# Patient Record
Sex: Female | Born: 1970
Health system: Southern US, Community
[De-identification: ages and names within clinical notes are randomized; demographics above are authoritative.]

## PROBLEM LIST (undated history)

## (undated) DIAGNOSIS — L309 Dermatitis, unspecified: Secondary | ICD-10-CM

## (undated) DIAGNOSIS — J189 Pneumonia, unspecified organism: Secondary | ICD-10-CM

## (undated) DIAGNOSIS — R112 Nausea with vomiting, unspecified: Secondary | ICD-10-CM

## (undated) DIAGNOSIS — M1990 Unspecified osteoarthritis, unspecified site: Secondary | ICD-10-CM

## (undated) DIAGNOSIS — J45909 Unspecified asthma, uncomplicated: Secondary | ICD-10-CM

## (undated) DIAGNOSIS — Z9889 Other specified postprocedural states: Secondary | ICD-10-CM

## (undated) DIAGNOSIS — T783XXA Angioneurotic edema, initial encounter: Secondary | ICD-10-CM

## (undated) DIAGNOSIS — M1631 Unilateral osteoarthritis resulting from hip dysplasia, right hip: Secondary | ICD-10-CM

## (undated) DIAGNOSIS — K900 Celiac disease: Secondary | ICD-10-CM

## (undated) DIAGNOSIS — L509 Urticaria, unspecified: Secondary | ICD-10-CM

## (undated) HISTORY — PX: BREAST SURGERY: SHX581

## (undated) HISTORY — DX: Angioneurotic edema, initial encounter: T78.3XXA

## (undated) HISTORY — DX: Dermatitis, unspecified: L30.9

## (undated) HISTORY — DX: Urticaria, unspecified: L50.9

## (undated) HISTORY — PX: CARPAL TUNNEL RELEASE: SHX101

## (undated) HISTORY — PX: OTHER SURGICAL HISTORY: SHX169

---

## 2012-05-23 ENCOUNTER — Other Ambulatory Visit: Payer: Self-pay

## 2012-05-23 ENCOUNTER — Other Ambulatory Visit: Payer: Self-pay | Admitting: Legal Medicine

## 2012-05-23 DIAGNOSIS — N644 Mastodynia: Secondary | ICD-10-CM

## 2012-05-23 DIAGNOSIS — N63 Unspecified lump in unspecified breast: Secondary | ICD-10-CM

## 2012-05-25 ENCOUNTER — Other Ambulatory Visit: Payer: Self-pay | Admitting: Legal Medicine

## 2012-05-25 DIAGNOSIS — N644 Mastodynia: Secondary | ICD-10-CM

## 2012-05-25 DIAGNOSIS — N63 Unspecified lump in unspecified breast: Secondary | ICD-10-CM

## 2012-06-01 ENCOUNTER — Other Ambulatory Visit: Payer: Self-pay

## 2012-08-12 ENCOUNTER — Ambulatory Visit
Admission: RE | Admit: 2012-08-12 | Discharge: 2012-08-12 | Disposition: A | Discharge status: Home / Self Care | Payer: PRIVATE HEALTH INSURANCE | Source: Ambulatory Visit | Attending: Legal Medicine | Admitting: Legal Medicine

## 2012-08-12 DIAGNOSIS — N644 Mastodynia: Secondary | ICD-10-CM

## 2012-08-12 DIAGNOSIS — N63 Unspecified lump in unspecified breast: Secondary | ICD-10-CM

## 2013-11-14 ENCOUNTER — Other Ambulatory Visit: Payer: Self-pay

## 2013-11-14 ENCOUNTER — Other Ambulatory Visit: Payer: Self-pay | Admitting: Gynecology

## 2013-11-14 DIAGNOSIS — Z1231 Encounter for screening mammogram for malignant neoplasm of breast: Secondary | ICD-10-CM

## 2013-11-28 ENCOUNTER — Ambulatory Visit
Admission: RE | Admit: 2013-11-28 | Discharge: 2013-11-28 | Disposition: A | Discharge status: Home / Self Care | Payer: PRIVATE HEALTH INSURANCE | Source: Ambulatory Visit

## 2013-11-28 DIAGNOSIS — Z1231 Encounter for screening mammogram for malignant neoplasm of breast: Secondary | ICD-10-CM

## 2013-12-01 ENCOUNTER — Other Ambulatory Visit: Payer: Self-pay | Admitting: Obstetrics and Gynecology

## 2013-12-01 DIAGNOSIS — R928 Other abnormal and inconclusive findings on diagnostic imaging of breast: Secondary | ICD-10-CM

## 2013-12-15 ENCOUNTER — Ambulatory Visit
Admission: RE | Admit: 2013-12-15 | Discharge: 2013-12-15 | Disposition: A | Discharge status: Home / Self Care | Payer: PRIVATE HEALTH INSURANCE | Source: Ambulatory Visit | Attending: Obstetrics and Gynecology | Admitting: Obstetrics and Gynecology

## 2013-12-15 ENCOUNTER — Other Ambulatory Visit: Payer: Self-pay | Admitting: Obstetrics and Gynecology

## 2013-12-15 DIAGNOSIS — R928 Other abnormal and inconclusive findings on diagnostic imaging of breast: Secondary | ICD-10-CM

## 2013-12-20 ENCOUNTER — Other Ambulatory Visit: Payer: Self-pay | Admitting: Obstetrics and Gynecology

## 2013-12-20 ENCOUNTER — Ambulatory Visit
Admission: RE | Admit: 2013-12-20 | Discharge: 2013-12-20 | Disposition: A | Discharge status: Home / Self Care | Payer: PRIVATE HEALTH INSURANCE | Source: Ambulatory Visit | Attending: Obstetrics and Gynecology | Admitting: Obstetrics and Gynecology

## 2013-12-20 ENCOUNTER — Other Ambulatory Visit (HOSPITAL_COMMUNITY): Payer: Self-pay | Admitting: Radiology

## 2013-12-20 DIAGNOSIS — R928 Other abnormal and inconclusive findings on diagnostic imaging of breast: Secondary | ICD-10-CM

## 2013-12-20 DIAGNOSIS — N6489 Other specified disorders of breast: Secondary | ICD-10-CM

## 2014-05-24 ENCOUNTER — Other Ambulatory Visit: Payer: Self-pay | Admitting: Obstetrics and Gynecology

## 2014-05-24 DIAGNOSIS — N6489 Other specified disorders of breast: Secondary | ICD-10-CM

## 2014-06-27 ENCOUNTER — Encounter (INDEPENDENT_AMBULATORY_CARE_PROVIDER_SITE_OTHER): Payer: Self-pay

## 2014-06-27 ENCOUNTER — Ambulatory Visit
Admission: RE | Admit: 2014-06-27 | Discharge: 2014-06-27 | Disposition: A | Discharge status: Home / Self Care | Payer: PRIVATE HEALTH INSURANCE | Source: Ambulatory Visit | Attending: Obstetrics and Gynecology | Admitting: Obstetrics and Gynecology

## 2014-06-27 DIAGNOSIS — N6489 Other specified disorders of breast: Secondary | ICD-10-CM

## 2015-04-25 ENCOUNTER — Encounter (HOSPITAL_COMMUNITY): Payer: Self-pay

## 2015-04-25 ENCOUNTER — Other Ambulatory Visit (HOSPITAL_COMMUNITY): Payer: Self-pay | Admitting: *Deleted

## 2015-04-25 ENCOUNTER — Encounter (HOSPITAL_COMMUNITY)
Admission: RE | Admit: 2015-04-25 | Discharge: 2015-04-25 | Disposition: A | Discharge status: Home / Self Care | Payer: PRIVATE HEALTH INSURANCE | Source: Ambulatory Visit | Attending: Orthopedic Surgery | Admitting: Orthopedic Surgery

## 2015-04-25 DIAGNOSIS — Z01812 Encounter for preprocedural laboratory examination: Secondary | ICD-10-CM | POA: Insufficient documentation

## 2015-04-25 DIAGNOSIS — M1611 Unilateral primary osteoarthritis, right hip: Secondary | ICD-10-CM | POA: Diagnosis not present

## 2015-04-25 HISTORY — DX: Pneumonia, unspecified organism: J18.9

## 2015-04-25 HISTORY — DX: Celiac disease: K90.0

## 2015-04-25 HISTORY — DX: Other specified postprocedural states: Z98.890

## 2015-04-25 HISTORY — DX: Nausea with vomiting, unspecified: R11.2

## 2015-04-25 HISTORY — DX: Unspecified asthma, uncomplicated: J45.909

## 2015-04-25 HISTORY — DX: Unspecified osteoarthritis, unspecified site: M19.90

## 2015-04-25 LAB — CBC
HEMATOCRIT: 39 % (ref 36.0–46.0)
HEMOGLOBIN: 12.9 g/dL (ref 12.0–15.0)
MCH: 30 pg (ref 26.0–34.0)
MCHC: 33.1 g/dL (ref 30.0–36.0)
MCV: 90.7 fL (ref 78.0–100.0)
Platelets: 306 10*3/uL (ref 150–400)
RBC: 4.3 MIL/uL (ref 3.87–5.11)
RDW: 14.4 % (ref 11.5–15.5)
WBC: 6.5 10*3/uL (ref 4.0–10.5)

## 2015-04-25 LAB — SURGICAL PCR SCREEN
MRSA, PCR: NEGATIVE
STAPHYLOCOCCUS AUREUS: POSITIVE — AB

## 2015-04-25 LAB — BASIC METABOLIC PANEL
ANION GAP: 5 (ref 5–15)
BUN: 8 mg/dL (ref 6–20)
CALCIUM: 9.4 mg/dL (ref 8.9–10.3)
CO2: 28 mmol/L (ref 22–32)
CREATININE: 0.56 mg/dL (ref 0.44–1.00)
Chloride: 107 mmol/L (ref 101–111)
Glucose, Bld: 91 mg/dL (ref 65–99)
Potassium: 4.1 mmol/L (ref 3.5–5.1)
Sodium: 140 mmol/L (ref 135–145)

## 2015-04-25 NOTE — Progress Notes (Signed)
Mupirocin Ointment Rx called into [facility] in [place] for positive PCR of staph. Pt notified and voiced understanding.

## 2015-04-25 NOTE — Pre-Procedure Instructions (Signed)
Melinda Guerrero  04/25/2015      Your procedure is scheduled on Tuesday, May 07, 2015 at 10:25 AM.   [facility] Entr[facility] "A" Admitting Office at 8:30 AM.   Call this number if you have problems the morning of surgery: [phone]   Any questions prior to day of surgery, please call [phone] between 8 & 4 PM.    Remember:  Do not eat food or drink liquids after midnight Monday, 05/06/15.  Take these medicines the morning of surgery with A SIP OF WATER: Oxycodone - if needed, Symbicort inhaler - if needed   Do not wear jewelry, make-up or nail polish.  Do not wear lotions, powders, or perfumes.  You may wear deodorant.  Do not shave 48 hours prior to surgery.    Do not bring valuables to the hospital.  [facility] is not responsible for any belongings or valuables.  Contacts, dentures or bridgework may not be worn into surgery.  Leave your suitcase in the car.  After surgery it may be brought to your room.  For patients admitted to the hospital, discharge time will be determined by your treatment team.  Special instructions: [facility] - Preparing for Surgery  Before surgery, you can play an important role.  Because skin is not sterile, your skin needs to be as free of germs as possible.  You can reduce the number of germs on you skin by washing with CHG (chlorahexidine gluconate) soap before surgery.  CHG is an antiseptic cleaner which kills germs and bonds with the skin to continue killing germs even after washing.  Please DO NOT use if you have an allergy to CHG or antibacterial soaps.  If your skin becomes reddened/irritated stop using the CHG and inform your nurse when you arrive at Short Stay.  Do not shave (including legs and underarms) for at least 48 hours prior to the first CHG shower.  You may shave your face.  Please follow these instructions carefully:   1.  Shower with CHG Soap the night before surgery and the                                 morning of Surgery.  2.  If you choose to wash your hair, wash your hair first as usual with your       normal shampoo.  3.  After you shampoo, rinse your hair and body thoroughly to remove the                      Shampoo.  4.  Use CHG as you would any other liquid soap.  You can apply chg directly       to the skin and wash gently with scrungie or a clean washcloth.  5.  Apply the CHG Soap to your body ONLY FROM THE NECK DOWN.        Do not use on open wounds or open sores.  Avoid contact with your eyes, ears, mouth and genitals (private parts).  Wash genitals (private parts) with your normal soap.  6.  Wash thoroughly, paying special attention to the area where your surgery        will be performed.  7.  Thoroughly rinse your body with warm water from the neck down.  8.  DO NOT shower/wash with your normal soap after using and rinsing off  the CHG Soap.  9.  Pat yourself dry with a clean towel.            10.  Wear clean pajamas.            11.  Place clean sheets on your bed the night of your first shower and do not        sleep with pets.  Day of Surgery  Do not apply any lotions the morning of surgery.  Please wear clean clothes to the hospital.    Please read over the following fact sheets that you were given. Pain Booklet, Coughing and Deep Breathing, MRSA Information and Surgical Site Infection Prevention

## 2015-04-26 ENCOUNTER — Other Ambulatory Visit (HOSPITAL_COMMUNITY): Payer: PRIVATE HEALTH INSURANCE

## 2015-04-30 ENCOUNTER — Other Ambulatory Visit (HOSPITAL_COMMUNITY): Payer: PRIVATE HEALTH INSURANCE

## 2015-05-06 MED ORDER — CEFAZOLIN SODIUM-DEXTROSE 2-3 GM-% IV SOLR
2.0000 g | INTRAVENOUS | Status: AC
  Administered 2015-05-07: 2 g via INTRAVENOUS
  Filled 2015-05-06: qty 50

## 2015-05-07 ENCOUNTER — Encounter (HOSPITAL_COMMUNITY): Payer: Self-pay | Admitting: *Deleted

## 2015-05-07 ENCOUNTER — Inpatient Hospital Stay (HOSPITAL_COMMUNITY): Payer: PRIVATE HEALTH INSURANCE | Admitting: Anesthesiology

## 2015-05-07 ENCOUNTER — Inpatient Hospital Stay (HOSPITAL_COMMUNITY)
Admission: RE | Admit: 2015-05-07 | Discharge: 2015-05-10 | DRG: 470 | Disposition: A | Discharge status: Home Health | Payer: PRIVATE HEALTH INSURANCE | Source: Ambulatory Visit | Attending: Orthopedic Surgery | Admitting: Orthopedic Surgery

## 2015-05-07 ENCOUNTER — Encounter (HOSPITAL_COMMUNITY)
Admission: RE | Disposition: A | Discharge status: Home Health | Payer: Self-pay | Source: Ambulatory Visit | Attending: Orthopedic Surgery

## 2015-05-07 ENCOUNTER — Inpatient Hospital Stay (HOSPITAL_COMMUNITY): Payer: PRIVATE HEALTH INSURANCE

## 2015-05-07 DIAGNOSIS — Z8249 Family history of ischemic heart disease and other diseases of the circulatory system: Secondary | ICD-10-CM

## 2015-05-07 DIAGNOSIS — J45909 Unspecified asthma, uncomplicated: Secondary | ICD-10-CM | POA: Diagnosis present

## 2015-05-07 DIAGNOSIS — M1610 Unilateral primary osteoarthritis, unspecified hip: Secondary | ICD-10-CM | POA: Diagnosis present

## 2015-05-07 DIAGNOSIS — Z79899 Other long term (current) drug therapy: Secondary | ICD-10-CM

## 2015-05-07 DIAGNOSIS — Z91012 Allergy to eggs: Secondary | ICD-10-CM | POA: Diagnosis not present

## 2015-05-07 DIAGNOSIS — Z91013 Allergy to seafood: Secondary | ICD-10-CM

## 2015-05-07 DIAGNOSIS — Z7952 Long term (current) use of systemic steroids: Secondary | ICD-10-CM

## 2015-05-07 DIAGNOSIS — D62 Acute posthemorrhagic anemia: Secondary | ICD-10-CM | POA: Diagnosis not present

## 2015-05-07 DIAGNOSIS — Z9889 Other specified postprocedural states: Secondary | ICD-10-CM | POA: Diagnosis present

## 2015-05-07 DIAGNOSIS — M1631 Unilateral osteoarthritis resulting from hip dysplasia, right hip: Secondary | ICD-10-CM | POA: Diagnosis present

## 2015-05-07 DIAGNOSIS — K900 Celiac disease: Secondary | ICD-10-CM | POA: Diagnosis present

## 2015-05-07 DIAGNOSIS — R112 Nausea with vomiting, unspecified: Secondary | ICD-10-CM | POA: Diagnosis not present

## 2015-05-07 DIAGNOSIS — Z91041 Radiographic dye allergy status: Secondary | ICD-10-CM

## 2015-05-07 DIAGNOSIS — M1611 Unilateral primary osteoarthritis, right hip: Secondary | ICD-10-CM | POA: Diagnosis present

## 2015-05-07 HISTORY — DX: Unilateral osteoarthritis resulting from hip dysplasia, right hip: M16.31

## 2015-05-07 HISTORY — PX: TOTAL HIP ARTHROPLASTY: SHX124

## 2015-05-07 LAB — HCG, SERUM, QUALITATIVE: PREG SERUM: NEGATIVE

## 2015-05-07 SURGERY — ARTHROPLASTY, HIP, TOTAL,POSTERIOR APPROACH
Anesthesia: General | Site: Hip | Laterality: Right

## 2015-05-07 MED ORDER — RIVAROXABAN 10 MG PO TABS
10.0000 mg | ORAL_TABLET | Freq: Every day | ORAL | Status: DC
  Administered 2015-05-08 – 2015-05-10 (×3): 10 mg via ORAL
  Filled 2015-05-07 (×3): qty 1

## 2015-05-07 MED ORDER — POLYETHYLENE GLYCOL 3350 17 G PO PACK: 17.0000 g | PACK | Freq: Every day | ORAL | Status: DC | PRN

## 2015-05-07 MED ORDER — HYDROMORPHONE HCL 1 MG/ML IJ SOLN
INTRAMUSCULAR | Status: AC
  Filled 2015-05-07: qty 1

## 2015-05-07 MED ORDER — MIDAZOLAM HCL 2 MG/2ML IJ SOLN
INTRAMUSCULAR | Status: AC
  Filled 2015-05-07: qty 2

## 2015-05-07 MED ORDER — KETOROLAC TROMETHAMINE 15 MG/ML IJ SOLN
INTRAMUSCULAR | Status: AC
  Filled 2015-05-07: qty 1

## 2015-05-07 MED ORDER — RIVAROXABAN 10 MG PO TABS: 10.0000 mg | ORAL_TABLET | Freq: Every day | ORAL | Status: DC

## 2015-05-07 MED ORDER — BISACODYL 10 MG RE SUPP: 10.0000 mg | Freq: Every day | RECTAL | Status: DC | PRN

## 2015-05-07 MED ORDER — METHOCARBAMOL 500 MG PO TABS
500.0000 mg | ORAL_TABLET | Freq: Four times a day (QID) | ORAL | Status: DC | PRN
  Administered 2015-05-07 – 2015-05-10 (×8): 500 mg via ORAL
  Filled 2015-05-07 (×8): qty 1

## 2015-05-07 MED ORDER — METHOCARBAMOL 1000 MG/10ML IJ SOLN
500.0000 mg | Freq: Four times a day (QID) | INTRAVENOUS | Status: DC | PRN
  Filled 2015-05-07: qty 5

## 2015-05-07 MED ORDER — ACETAMINOPHEN 650 MG RE SUPP: 650.0000 mg | Freq: Four times a day (QID) | RECTAL | Status: DC | PRN

## 2015-05-07 MED ORDER — LIDOCAINE HCL (CARDIAC) 20 MG/ML IV SOLN
INTRAVENOUS | Status: DC | PRN
  Administered 2015-05-07: 50 mg via INTRAVENOUS

## 2015-05-07 MED ORDER — OXYCODONE HCL 5 MG/5ML PO SOLN: 5.0000 mg | Freq: Once | ORAL | Status: DC | PRN

## 2015-05-07 MED ORDER — MENTHOL 3 MG MT LOZG: 1.0000 | LOZENGE | OROMUCOSAL | Status: DC | PRN

## 2015-05-07 MED ORDER — OXYCODONE HCL 5 MG PO TABS
ORAL_TABLET | ORAL | Status: AC
  Filled 2015-05-07: qty 2

## 2015-05-07 MED ORDER — NEOSTIGMINE METHYLSULFATE 10 MG/10ML IV SOLN
INTRAVENOUS | Status: AC
  Filled 2015-05-07: qty 1

## 2015-05-07 MED ORDER — ROCURONIUM BROMIDE 100 MG/10ML IV SOLN
INTRAVENOUS | Status: DC | PRN
  Administered 2015-05-07: 50 mg via INTRAVENOUS

## 2015-05-07 MED ORDER — FENTANYL CITRATE (PF) 100 MCG/2ML IJ SOLN
INTRAMUSCULAR | Status: AC
  Filled 2015-05-07: qty 2

## 2015-05-07 MED ORDER — GLYCOPYRROLATE 0.2 MG/ML IJ SOLN
INTRAMUSCULAR | Status: DC | PRN
  Administered 2015-05-07: .4 mg via INTRAVENOUS

## 2015-05-07 MED ORDER — METOCLOPRAMIDE HCL 5 MG/ML IJ SOLN: 5.0000 mg | Freq: Three times a day (TID) | INTRAMUSCULAR | Status: DC | PRN

## 2015-05-07 MED ORDER — CEFAZOLIN SODIUM-DEXTROSE 2-3 GM-% IV SOLR
2.0000 g | Freq: Four times a day (QID) | INTRAVENOUS | Status: AC
  Administered 2015-05-07 (×2): 2 g via INTRAVENOUS
  Filled 2015-05-07: qty 50

## 2015-05-07 MED ORDER — ONDANSETRON HCL 4 MG/2ML IJ SOLN
4.0000 mg | Freq: Four times a day (QID) | INTRAMUSCULAR | Status: DC | PRN
  Administered 2015-05-08: 4 mg via INTRAVENOUS
  Filled 2015-05-07: qty 2

## 2015-05-07 MED ORDER — LIDOCAINE HCL (CARDIAC) 20 MG/ML IV SOLN
INTRAVENOUS | Status: AC
  Filled 2015-05-07: qty 15

## 2015-05-07 MED ORDER — OXYCODONE HCL 5 MG PO TABS
5.0000 mg | ORAL_TABLET | ORAL | Status: DC | PRN
  Administered 2015-05-07 – 2015-05-10 (×18): 10 mg via ORAL
  Filled 2015-05-07 (×16): qty 2

## 2015-05-07 MED ORDER — PROPOFOL 10 MG/ML IV BOLUS
INTRAVENOUS | Status: DC | PRN
  Administered 2015-05-07: 200 mg via INTRAVENOUS

## 2015-05-07 MED ORDER — ONDANSETRON HCL 4 MG/2ML IJ SOLN: 4.0000 mg | Freq: Once | INTRAMUSCULAR | Status: DC | PRN

## 2015-05-07 MED ORDER — ZOLPIDEM TARTRATE 5 MG PO TABS: 5.0000 mg | ORAL_TABLET | Freq: Every evening | ORAL | Status: DC | PRN

## 2015-05-07 MED ORDER — ARTIFICIAL TEARS OP OINT
TOPICAL_OINTMENT | OPHTHALMIC | Status: AC
  Filled 2015-05-07: qty 3.5

## 2015-05-07 MED ORDER — FENTANYL CITRATE (PF) 250 MCG/5ML IJ SOLN
INTRAMUSCULAR | Status: DC | PRN
  Administered 2015-05-07: 50 ug via INTRAVENOUS
  Administered 2015-05-07: 100 ug via INTRAVENOUS
  Administered 2015-05-07: 150 ug via INTRAVENOUS
  Administered 2015-05-07: 50 ug via INTRAVENOUS

## 2015-05-07 MED ORDER — SENNA-DOCUSATE SODIUM 8.6-50 MG PO TABS: 2.0000 | ORAL_TABLET | Freq: Every day | ORAL | Status: DC

## 2015-05-07 MED ORDER — MAGNESIUM CITRATE PO SOLN: 1.0000 | Freq: Once | ORAL | Status: AC | PRN

## 2015-05-07 MED ORDER — DEXAMETHASONE SODIUM PHOSPHATE 10 MG/ML IJ SOLN
10.0000 mg | Freq: Once | INTRAMUSCULAR | Status: AC
  Administered 2015-05-08: 10 mg via INTRAVENOUS
  Filled 2015-05-07: qty 1

## 2015-05-07 MED ORDER — PROPOFOL 10 MG/ML IV BOLUS
INTRAVENOUS | Status: AC
  Filled 2015-05-07: qty 20

## 2015-05-07 MED ORDER — HYDROMORPHONE HCL 1 MG/ML IJ SOLN
1.0000 mg | INTRAMUSCULAR | Status: DC | PRN
  Administered 2015-05-07 – 2015-05-10 (×11): 1 mg via INTRAVENOUS
  Filled 2015-05-07 (×10): qty 1

## 2015-05-07 MED ORDER — CEFAZOLIN SODIUM-DEXTROSE 2-3 GM-% IV SOLR
INTRAVENOUS | Status: AC
  Filled 2015-05-07: qty 50

## 2015-05-07 MED ORDER — BUDESONIDE-FORMOTEROL FUMARATE 160-4.5 MCG/ACT IN AERO
1.0000 | INHALATION_SPRAY | Freq: Every day | RESPIRATORY_TRACT | Status: DC | PRN
  Filled 2015-05-07: qty 6

## 2015-05-07 MED ORDER — KETOROLAC TROMETHAMINE 15 MG/ML IJ SOLN
15.0000 mg | Freq: Four times a day (QID) | INTRAMUSCULAR | Status: AC
  Administered 2015-05-07 – 2015-05-08 (×4): 15 mg via INTRAVENOUS
  Filled 2015-05-07 (×3): qty 1

## 2015-05-07 MED ORDER — METHOCARBAMOL 1000 MG/10ML IJ SOLN
500.0000 mg | INTRAVENOUS | Status: AC
  Administered 2015-05-07: 500 mg via INTRAVENOUS
  Filled 2015-05-07: qty 5

## 2015-05-07 MED ORDER — EPHEDRINE SULFATE 50 MG/ML IJ SOLN
INTRAMUSCULAR | Status: AC
  Filled 2015-05-07: qty 2

## 2015-05-07 MED ORDER — ONDANSETRON HCL 4 MG PO TABS: 4.0000 mg | ORAL_TABLET | Freq: Three times a day (TID) | ORAL | Status: DC | PRN

## 2015-05-07 MED ORDER — NEOSTIGMINE METHYLSULFATE 10 MG/10ML IV SOLN
INTRAVENOUS | Status: DC | PRN
  Administered 2015-05-07: 3 mg via INTRAVENOUS

## 2015-05-07 MED ORDER — ONDANSETRON HCL 4 MG/2ML IJ SOLN
INTRAMUSCULAR | Status: AC
  Filled 2015-05-07: qty 4

## 2015-05-07 MED ORDER — MIDAZOLAM HCL 5 MG/5ML IJ SOLN
INTRAMUSCULAR | Status: DC | PRN
  Administered 2015-05-07: 2 mg via INTRAVENOUS

## 2015-05-07 MED ORDER — PHENYLEPHRINE 40 MCG/ML (10ML) SYRINGE FOR IV PUSH (FOR BLOOD PRESSURE SUPPORT)
PREFILLED_SYRINGE | INTRAVENOUS | Status: AC
  Filled 2015-05-07: qty 20

## 2015-05-07 MED ORDER — ACETAMINOPHEN 325 MG PO TABS
650.0000 mg | ORAL_TABLET | Freq: Four times a day (QID) | ORAL | Status: DC | PRN
  Administered 2015-05-09 – 2015-05-10 (×2): 650 mg via ORAL
  Filled 2015-05-07 (×2): qty 2

## 2015-05-07 MED ORDER — LACTATED RINGERS IV SOLN
INTRAVENOUS | Status: DC
  Administered 2015-05-07 (×2): via INTRAVENOUS

## 2015-05-07 MED ORDER — POTASSIUM CHLORIDE IN NACL 20-0.45 MEQ/L-% IV SOLN
INTRAVENOUS | Status: DC
  Administered 2015-05-08 – 2015-05-09 (×2): via INTRAVENOUS
  Filled 2015-05-07 (×7): qty 1000

## 2015-05-07 MED ORDER — BACLOFEN 10 MG PO TABS: 10.0000 mg | ORAL_TABLET | Freq: Three times a day (TID) | ORAL | Status: DC

## 2015-05-07 MED ORDER — PHENOL 1.4 % MT LIQD: 1.0000 | OROMUCOSAL | Status: DC | PRN

## 2015-05-07 MED ORDER — ROCURONIUM BROMIDE 50 MG/5ML IV SOLN
INTRAVENOUS | Status: AC
  Filled 2015-05-07: qty 2

## 2015-05-07 MED ORDER — ALUM & MAG HYDROXIDE-SIMETH 200-200-20 MG/5ML PO SUSP: 30.0000 mL | ORAL | Status: DC | PRN

## 2015-05-07 MED ORDER — OXYCODONE HCL 5 MG PO TABS: 5.0000 mg | ORAL_TABLET | Freq: Once | ORAL | Status: DC | PRN

## 2015-05-07 MED ORDER — DIPHENHYDRAMINE HCL 12.5 MG/5ML PO ELIX: 12.5000 mg | ORAL_SOLUTION | ORAL | Status: DC | PRN

## 2015-05-07 MED ORDER — SODIUM CHLORIDE 0.9 % IJ SOLN
INTRAMUSCULAR | Status: AC
  Filled 2015-05-07: qty 20

## 2015-05-07 MED ORDER — FENTANYL CITRATE (PF) 250 MCG/5ML IJ SOLN
INTRAMUSCULAR | Status: AC
  Filled 2015-05-07: qty 5

## 2015-05-07 MED ORDER — SODIUM CHLORIDE 0.9 % IR SOLN
Status: DC | PRN
  Administered 2015-05-07: 1000 mL

## 2015-05-07 MED ORDER — DOCUSATE SODIUM 100 MG PO CAPS
100.0000 mg | ORAL_CAPSULE | Freq: Two times a day (BID) | ORAL | Status: DC
  Administered 2015-05-07 – 2015-05-10 (×6): 100 mg via ORAL
  Filled 2015-05-07 (×6): qty 1

## 2015-05-07 MED ORDER — SUCCINYLCHOLINE CHLORIDE 20 MG/ML IJ SOLN
INTRAMUSCULAR | Status: AC
  Filled 2015-05-07: qty 1

## 2015-05-07 MED ORDER — SENNA 8.6 MG PO TABS
1.0000 | ORAL_TABLET | Freq: Two times a day (BID) | ORAL | Status: DC
  Administered 2015-05-07 – 2015-05-10 (×6): 8.6 mg via ORAL
  Filled 2015-05-07 (×7): qty 1

## 2015-05-07 MED ORDER — FENTANYL CITRATE (PF) 100 MCG/2ML IJ SOLN
25.0000 ug | INTRAMUSCULAR | Status: DC | PRN
  Administered 2015-05-07 (×3): 50 ug via INTRAVENOUS

## 2015-05-07 MED ORDER — MIDAZOLAM HCL 2 MG/2ML IJ SOLN
2.0000 mg | Freq: Once | INTRAMUSCULAR | Status: AC
  Administered 2015-05-07: 2 mg via INTRAVENOUS
  Filled 2015-05-07: qty 2

## 2015-05-07 MED ORDER — OXYCODONE-ACETAMINOPHEN 10-325 MG PO TABS: 1.0000 | ORAL_TABLET | Freq: Four times a day (QID) | ORAL | Status: DC | PRN

## 2015-05-07 MED ORDER — ONDANSETRON HCL 4 MG PO TABS: 4.0000 mg | ORAL_TABLET | Freq: Four times a day (QID) | ORAL | Status: DC | PRN

## 2015-05-07 MED ORDER — ONDANSETRON HCL 4 MG/2ML IJ SOLN
INTRAMUSCULAR | Status: DC | PRN
  Administered 2015-05-07: 4 mg via INTRAVENOUS

## 2015-05-07 MED ORDER — METOCLOPRAMIDE HCL 5 MG PO TABS: 5.0000 mg | ORAL_TABLET | Freq: Three times a day (TID) | ORAL | Status: DC | PRN

## 2015-05-07 MED ORDER — GLYCOPYRROLATE 0.2 MG/ML IJ SOLN
INTRAMUSCULAR | Status: AC
  Filled 2015-05-07: qty 2

## 2015-05-07 SURGICAL SUPPLY — 62 items
BLADE SAW SAG 73X25 THK (BLADE) ×1
BLADE SAW SGTL 73X25 THK (BLADE) ×1 IMPLANT
BRUSH FEMORAL CANAL (MISCELLANEOUS) IMPLANT
CAPT HIP TOTAL 2 ×2 IMPLANT
CLSR STERI-STRIP ANTIMIC 1/2X4 (GAUZE/BANDAGES/DRESSINGS) ×4 IMPLANT
COVER SURGICAL LIGHT HANDLE (MISCELLANEOUS) ×2 IMPLANT
DRAPE IMP U-DRAPE 54X76 (DRAPES) ×2 IMPLANT
DRAPE INCISE IOBAN 66X45 STRL (DRAPES) IMPLANT
DRAPE ORTHO SPLIT 77X108 STRL (DRAPES) ×2
DRAPE PROXIMA HALF (DRAPES) ×4 IMPLANT
DRAPE SURG ORHT 6 SPLT 77X108 (DRAPES) ×2 IMPLANT
DRAPE U-SHAPE 47X51 STRL (DRAPES) ×2 IMPLANT
DRILL BIT 5/64 (BIT) ×2 IMPLANT
DRSG MEPILEX BORDER 4X12 (GAUZE/BANDAGES/DRESSINGS) ×4 IMPLANT
DRSG MEPILEX BORDER 4X8 (GAUZE/BANDAGES/DRESSINGS) IMPLANT
DRSG PAD ABDOMINAL 8X10 ST (GAUZE/BANDAGES/DRESSINGS) IMPLANT
DURAPREP 26ML APPLICATOR (WOUND CARE) ×2 IMPLANT
ELECT CAUTERY BLADE 6.4 (BLADE) ×2 IMPLANT
ELECT REM PT RETURN 9FT ADLT (ELECTROSURGICAL) ×2
ELECTRODE REM PT RTRN 9FT ADLT (ELECTROSURGICAL) ×1 IMPLANT
GLOVE BIOGEL PI IND STRL 8 (GLOVE) ×1 IMPLANT
GLOVE BIOGEL PI INDICATOR 8 (GLOVE) ×1
GLOVE BIOGEL PI ORTHO PRO SZ8 (GLOVE) ×1
GLOVE ORTHO TXT STRL SZ7.5 (GLOVE) ×2 IMPLANT
GLOVE PI ORTHO PRO STRL SZ8 (GLOVE) ×1 IMPLANT
GLOVE SURG ORTHO 8.0 STRL STRW (GLOVE) ×2 IMPLANT
GOWN STRL REUS W/ TWL XL LVL3 (GOWN DISPOSABLE) ×1 IMPLANT
GOWN STRL REUS W/TWL 2XL LVL3 (GOWN DISPOSABLE) ×2 IMPLANT
GOWN STRL REUS W/TWL XL LVL3 (GOWN DISPOSABLE) ×1
HANDPIECE INTERPULSE COAX TIP (DISPOSABLE)
HOOD PEEL AWAY FACE SHEILD DIS (HOOD) ×4 IMPLANT
KIT BASIN OR (CUSTOM PROCEDURE TRAY) ×2 IMPLANT
KIT ROOM TURNOVER OR (KITS) ×2 IMPLANT
MANIFOLD NEPTUNE II (INSTRUMENTS) ×2 IMPLANT
NDL SUT .5 MAYO 1.404X.05X (NEEDLE) ×1 IMPLANT
NEEDLE HYPO 25GX1X1/2 BEV (NEEDLE) ×2 IMPLANT
NEEDLE MAYO TAPER (NEEDLE) ×1
NS IRRIG 1000ML POUR BTL (IV SOLUTION) ×2 IMPLANT
PACK TOTAL JOINT (CUSTOM PROCEDURE TRAY) ×2 IMPLANT
PAD ARMBOARD 7.5X6 YLW CONV (MISCELLANEOUS) ×4 IMPLANT
PILLOW ABDUCTION HIP (SOFTGOODS) ×2 IMPLANT
PRESSURIZER FEMORAL UNIV (MISCELLANEOUS) IMPLANT
RETRIEVER SUT HEWSON (MISCELLANEOUS) ×2 IMPLANT
SET HNDPC FAN SPRY TIP SCT (DISPOSABLE) IMPLANT
SPONGE LAP 4X18 X RAY DECT (DISPOSABLE) IMPLANT
SUCTION FRAZIER TIP 10 FR DISP (SUCTIONS) ×2 IMPLANT
SUT FIBERWIRE #2 38 REV NDL BL (SUTURE) ×6
SUT MNCRL AB 3-0 PS2 18 (SUTURE) ×2 IMPLANT
SUT MNCRL AB 4-0 PS2 18 (SUTURE) ×2 IMPLANT
SUT VIC AB 0 CT1 27 (SUTURE) ×4
SUT VIC AB 0 CT1 27XBRD ANBCTR (SUTURE) ×4 IMPLANT
SUT VIC AB 2-0 CT1 27 (SUTURE) ×1
SUT VIC AB 2-0 CT1 TAPERPNT 27 (SUTURE) ×1 IMPLANT
SUT VIC AB 3-0 SH 8-18 (SUTURE) ×2 IMPLANT
SUTURE FIBERWR#2 38 REV NDL BL (SUTURE) ×3 IMPLANT
SYR CONTROL 10ML LL (SYRINGE) ×2 IMPLANT
TOWEL OR 17X24 6PK STRL BLUE (TOWEL DISPOSABLE) ×2 IMPLANT
TOWEL OR 17X26 10 PK STRL BLUE (TOWEL DISPOSABLE) ×2 IMPLANT
TOWER CARTRIDGE SMART MIX (DISPOSABLE) IMPLANT
TRAY CATH 16FR W/PLASTIC CATH (SET/KITS/TRAYS/PACK) ×2 IMPLANT
TRAY FOLEY CATH 14FR (SET/KITS/TRAYS/PACK) IMPLANT
WATER STERILE IRR 1000ML POUR (IV SOLUTION) ×4 IMPLANT

## 2015-05-07 NOTE — Progress Notes (Signed)
Pt c/o anxiousness and restlessness. Dr Noreene LarssonJoslin called and informed, new orders noted.

## 2015-05-07 NOTE — Transfer of Care (Signed)
Immediate Anesthesia Transfer of Care Note  Patient: Melinda Guerrero  Procedure(s) Performed: Procedure(s): TOTAL HIP ARTHROPLASTY (Right)  Patient Location: PACU  Anesthesia Type:General  Level of Consciousness: awake, alert  and oriented  Airway & Oxygen Therapy: Patient Spontanous Breathing and Patient connected to nasal cannula oxygen  Post-op Assessment: Report given to RN and Post -op Vital signs reviewed and stable  Post vital signs: Reviewed and stable  Last Vitals:  Filed Vitals:   05/07/15 1359  BP: 120/65  Pulse: 84  Temp: 36.5 C  Resp: 12    Complications: No apparent anesthesia complications

## 2015-05-07 NOTE — H&P (Signed)
PREOPERATIVE H&P  Chief Complaint: djd right hip  HPI: Melinda Guerrero is a 44 y.o. female who presents for preoperative history and physical with a diagnosis of djd right hip. Symptoms are rated as moderate to severe, and have been worsening.  This is significantly impairing activities of daily living.  She has elected for surgical management.   She has failed injections, activity modification, anti-inflammatories, and assistive devices.  Preoperative X-rays demonstrate end stage degenerative changes with osteophyte formation, loss of joint space, subchondral sclerosis. She has been on long-term oxycodone for her pain, which she has now discontinued, she is miserable and wants something done about her hip. She reports being unable to do basic things in her daily life.   Past Medical History  Diagnosis Date  . Asthma   . Pneumonia   . Arthritis     left hand after carpal tunnel release  . Celiac disease   . PONV (postoperative nausea and vomiting)    Past Surgical History  Procedure Laterality Date  . Carpal tunnel release Left   . Carpal tunnel release Right   . Left leg surgery      tendons and nerves removed to use to rebuild hand  . Breast surgery      breast biopsy (3 clips on right breast)   History   Social History  . Marital Status: Married    Spouse Name: N/A  . Number of Children: N/A  . Years of Education: N/A   Social History Main Topics  . Smoking status: Never Smoker   . Smokeless tobacco: Never Used  . Alcohol Use: No  . Drug Use: No  . Sexual Activity: Not on file   Other Topics Concern  . None   Social History Narrative   Family History  Problem Relation Age of Onset  . Hypertension Mother   . Glaucoma Mother   . Heart disease Mother   . Lung cancer Father    Allergies  Allergen Reactions  . Eggs Or Egg-Derived Products Anaphylaxis and Hives  . Iodine Hives    With contrast dye - had hives, throat felt scratchy and some trouble  breathing  . Other Anaphylaxis and Hives    All seafood  . Shellfish Allergy Anaphylaxis and Hives   Prior to Admission medications   Medication Sig Start Date End Date Taking? Authorizing Provider  SYMBICORT 160-4.5 MCG/ACT inhaler Inhale 1 puff into the lungs daily as needed (shortness of breath).  01/25/15  Yes Historical Provider, MD  oxyCODONE (OXY IR/ROXICODONE) 5 MG immediate release tablet Take 5 mg by mouth every 4 (four) hours as needed for severe pain.    Historical Provider, MD     Positive ROS: All other systems have been reviewed and were otherwise negative with the exception of those mentioned in the HPI and as above.  Physical Exam: General: Alert, no acute distress Cardiovascular: No pedal edema Respiratory: No cyanosis, no use of accessory musculature GI: No organomegaly, abdomen is soft and non-tender Skin: No lesions in the area of chief complaint Neurologic: Sensation intact distally Psychiatric: Patient is competent for consent with normal mood and affect Lymphatic: No axillary or cervical lymphadenopathy  MUSCULOSKELETAL: Right hip range of motion is 0-100 with 20 of internal and external rotation, which do re-create pain. EHL and FHL are intact.  Assessment: Right hip dysplasia with advanced degenerative changes  Plan: Plan for Procedure(s): TOTAL HIP ARTHROPLASTY  The risks benefits and alternatives were discussed with the patient including but  not limited to the risks of nonoperative treatment, versus surgical intervention including infection, bleeding, nerve injury, periprosthetic fracture, the need for revision surgery, dislocation, leg length discrepancy, blood clots, cardiopulmonary complications, morbidity, mortality, among others, and they were willing to proceed.     Eulas PostLANDAU,Luisenrique Conran P, MD Cell [phone]   05/07/2015 10:04 AM

## 2015-05-07 NOTE — Progress Notes (Signed)
small rash like area noted to right upper hip after chg clothes applied, pt c/o area feeling warm to touch. Area washed off with water and cloth.

## 2015-05-07 NOTE — Progress Notes (Signed)
Pt reports less anxiety and less restlessness. Family at bedside.

## 2015-05-07 NOTE — Anesthesia Preprocedure Evaluation (Signed)
Anesthesia Evaluation  Patient identified by MRN, date of birth, ID band Patient awake    Reviewed: Allergy & Precautions, NPO status , Patient's Chart, lab work & pertinent test results  Airway Mallampati: II  TM Distance: >3 FB Neck ROM: Full    Dental  (+) Teeth Intact, Dental Advisory Given   Pulmonary  breath sounds clear to auscultation        Cardiovascular Rhythm:Regular Rate:Normal     Neuro/Psych    GI/Hepatic   Endo/Other    Renal/GU      Musculoskeletal   Abdominal (+) - obese,   Peds  Hematology   Anesthesia Other Findings   Reproductive/Obstetrics                             Anesthesia Physical Anesthesia Plan  ASA: II  Anesthesia Plan: General   Post-op Pain Management:    Induction: Intravenous  Airway Management Planned: Oral ETT  Additional Equipment:   Intra-op Plan:   Post-operative Plan:   Informed Consent: I have reviewed the patients History and Physical, chart, labs and discussed the procedure including the risks, benefits and alternatives for the proposed anesthesia with the patient or authorized representative who has indicated his/her understanding and acceptance.   Dental advisory given  Plan Discussed with: CRNA and Anesthesiologist  Anesthesia Plan Comments: (DJD R. Hip Asthma lungs clear Anxiety H/O Post-op N/V  Plan GA with oral ETT)        Anesthesia Quick Evaluation

## 2015-05-07 NOTE — Anesthesia Procedure Notes (Signed)
Procedure Name: Intubation Performed by: Marena ChancyBECKNER, Doron Shake S Oxygen Delivery Method: Circle system utilized Preoxygenation: Pre-oxygenation with 100% oxygen Intubation Type: IV induction Ventilation: Mask ventilation without difficulty Laryngoscope Size: Miller and 2 Grade View: Grade I Tube type: Oral Tube size: 7.5 mm Number of attempts: 1 Placement Confirmation: ETT inserted through vocal cords under direct vision,  breath sounds checked- equal and bilateral and positive ETCO2 Tube secured with: Tape Dental Injury: Teeth and Oropharynx as per pre-operative assessment

## 2015-05-07 NOTE — Anesthesia Postprocedure Evaluation (Signed)
  Anesthesia Post-op Note  Patient: Melinda Guerrero  Procedure(s) Performed: Procedure(s): TOTAL HIP ARTHROPLASTY (Right)  Patient Location: PACU  Anesthesia Type:General  Level of Consciousness: awake, alert  and oriented  Airway and Oxygen Therapy: Patient Spontanous Breathing and Patient connected to nasal cannula oxygen  Post-op Pain: mild  Post-op Assessment: Post-op Vital signs reviewed, Patient's Cardiovascular Status Stable, Respiratory Function Stable, Patent Airway and Pain level controlled              Post-op Vital Signs: stable  Last Vitals:  Filed Vitals:   05/07/15 1445  BP: 133/81  Pulse: 82  Temp:   Resp: 26    Complications: No apparent anesthesia complications

## 2015-05-07 NOTE — Op Note (Addendum)
05/07/2015  1:02 PM  PATIENT:  Melinda Guerrero   MRN: [id number]  PRE-OPERATIVE DIAGNOSIS:  Right hip osteoarthritis due to dysplasia  POST-OPERATIVE DIAGNOSIS:  Right hip osteoarthritis due to dysplasia  PROCEDURE:  Procedure(s): TOTAL HIP ARTHROPLASTY  PREOPERATIVE INDICATIONS:    Melinda Guerrero is an 44 y.o. female who has a diagnosis of Osteoarthritis resulting from right hip dysplasia and elected for surgical management after failing conservative treatment.  The risks benefits and alternatives were discussed with the patient including but not limited to the risks of nonoperative treatment, versus surgical intervention including infection, bleeding, nerve injury, periprosthetic fracture, the need for revision surgery, dislocation, leg length discrepancy, blood clots, cardiopulmonary complications, morbidity, mortality, among others, and they were willing to proceed.     OPERATIVE REPORT     SURGEON:  Marchia Bond, MD    ASSISTANT:  Matthew Saras, PA-C  (Present throughout the entire procedure,  necessary for completion of procedure in a timely manner, assisting with retraction, instrumentation, and closure)     ANESTHESIA:  Spinal    COMPLICATIONS:  None.     COMPONENTS:  [employer] fit femur size 4 with a 32 mm +1 head ball and a gription acetabular shell size 48 with a +4 neutral polyethylene liner  Unique aspects of the case: The acetabulum was dysplastic as expected, with superior deficiency. It was also extremely shallow. I had a fair amount of cup uncovered posteriorly. The femoral stem was between a size 4 and a size 5, I was able to get the 6 reamer down, but the size 5 broach would not completely seat, and I suspect that there was a metaphyseal diaphyseal mismatch, and I did it did not want to force the 5 down, so I elected to use a 4, which did seat nicely, and stopped with about 2 lines of beads on the prosthesis showing. The femoral head was  extremely small, measuring only a size 40. The articular cartilage was thinned superiorly, but there was not significant osteophyte formation. I think that she was simply having a lot of pain secondary to the dysplasia pressure phenomenon at the weightbearing dome.    PROCEDURE IN DETAIL:   The patient was met in the holding area and  identified.  The appropriate hip was identified and marked at the operative site.  The patient was then transported to the OR  and  placed under general anesthesia.  At that point, the patient was  placed in the lateral decubitus position with the operative side up and  secured to the operating room table and all bony prominences padded.     The operative lower extremity was prepped from the iliac crest to the distal leg.  Sterile draping was performed.  Time out was performed prior to incision.      A routine posterolateral approach was utilized via sharp dissection  carried down to the subcutaneous tissue.  Gross bleeders were Bovie coagulated.  The iliotibial band was identified and incised along the length of the skin incision.  Self-retaining retractors were  inserted.  With the hip internally rotated, the short external rotators  were identified. The piriformis and capsule was tagged with FiberWire, and the hip capsule released in a T-type fashion.  The femoral neck was exposed, and I resected the femoral neck using the appropriate jig. This was performed at approximately a thumb's breadth above the lesser trochanter.    I then exposed the deep acetabulum, cleared out any tissue including  the ligamentum teres.  A wing retractor was placed.  After adequate visualization, I excised the labrum, and then sequentially reamed.  I placed the trial acetabulum, which seated nicely, and then impacted the real cup into place.  Appropriate version and inclination was confirmed clinically and also with the use of the jig. Achieving appropriate version was somewhat challenging,  given her abnormal anatomy, however I basically matched her anterior wall, and had more posterior coverage and attempted to correct the inclination with the use of the guide.  A trial polyethylene liner was placed and the wing retractor removed.    I then prepared the proximal femur using the cookie-cutter, the lateralizing reamer, and then sequentially reamed and broached.  A trial broach, neck, and head was utilized, and I reduced the hip and it was found to have excellent stability with functional range of motion. I was in between a 4 and a 5 as indicated above, and selected the 4 in order to minimize risk for fracture of the femur. The 5 gave a very firm end feel, without fully seating. The trial components were then removed, and the real polyethylene liner was placed with the lip directed posteriorly.  I then impacted the real femoral prosthesis into place into the appropriate version, slightly anteverted to the normal anatomy, and I impacted the real head ball into place. The hip was then reduced and taken through functional range of motion and found to have excellent stability. Leg lengths were restored.  I then used a 2 mm drill bits to pass the FiberWire suture from the capsule and piriformis through the greater trochanter, and secured this. Excellent posterior capsular repair was achieved. I also closed the T in the capsule.  I then irrigated the hip copiously again with pulse lavage, and repaired the fascia with Vicryl, followed by Vicryl for the subcutaneous tissue, Monocryl for the skin, Steri-Strips and sterile gauze. The wounds were injected. The patient was then awakened and returned to PACU in stable and satisfactory condition. There were no complications.  Marchia Bond, MD Orthopedic Surgeon [phone]   05/07/2015 1:02 PM

## 2015-05-07 NOTE — OR Nursing (Signed)
Late entry for delay code documentation. 

## 2015-05-08 LAB — BASIC METABOLIC PANEL
Anion gap: 7 (ref 5–15)
BUN: 8 mg/dL (ref 6–20)
CALCIUM: 8.1 mg/dL — AB (ref 8.9–10.3)
CHLORIDE: 103 mmol/L (ref 101–111)
CO2: 26 mmol/L (ref 22–32)
CREATININE: 0.63 mg/dL (ref 0.44–1.00)
GFR calc non Af Amer: >60 mL/min (ref >60)
Glucose, Bld: 124 mg/dL — ABNORMAL HIGH (ref 65–99)
POTASSIUM: 4 mmol/L (ref 3.5–5.1)
Sodium: 136 mmol/L (ref 135–145)

## 2015-05-08 LAB — CBC
HCT: 29.5 % — ABNORMAL LOW (ref 36.0–46.0)
HEMOGLOBIN: 9.9 g/dL — AB (ref 12.0–15.0)
MCH: 30.7 pg (ref 26.0–34.0)
MCHC: 33.6 g/dL (ref 30.0–36.0)
MCV: 91.6 fL (ref 78.0–100.0)
Platelets: 258 10*3/uL (ref 150–400)
RBC: 3.22 MIL/uL — AB (ref 3.87–5.11)
RDW: 13.9 % (ref 11.5–15.5)
WBC: 7.9 10*3/uL (ref 4.0–10.5)

## 2015-05-08 NOTE — Progress Notes (Signed)
Utilization review completed.  

## 2015-05-08 NOTE — Progress Notes (Signed)
     Subjective:  Patient reports pain as moderate.  No complaints.    Objective:   VITALS:   Filed Vitals:   05/07/15 1835 05/07/15 2157 05/08/15 0016 05/08/15 0400  BP: 117/69 121/68 114/66 114/68  Pulse: 79 87 66 77  Temp: 97.8 F (36.6 C) 98 F (36.7 C) 98 F (36.7 C) 98.2 F (36.8 C)  TempSrc:  Oral Oral Oral  Resp: 16 16 18 18   Height:      Weight:      SpO2: 100% 100% 100% 100%    Neurologically intact Dorsiflexion/Plantar flexion intact Incision: dressing C/D/I   Lab Results  Component Value Date   WBC 7.9 05/08/2015   HGB 9.9* 05/08/2015   HCT 29.5* 05/08/2015   MCV 91.6 05/08/2015   PLT 258 05/08/2015   BMET    Component Value Date/Time   NA 136 05/08/2015 0452   K 4.0 05/08/2015 0452   CL 103 05/08/2015 0452   CO2 26 05/08/2015 0452   GLUCOSE 124* 05/08/2015 0452   BUN 8 05/08/2015 0452   CREATININE 0.63 05/08/2015 0452   CALCIUM 8.1* 05/08/2015 0452   GFRNONAA >60 05/08/2015 0452   GFRAA >60 05/08/2015 0452     Assessment/Plan: 1 Day Post-Op   Principal Problem:   Osteoarthritis resulting from right hip dysplasia Active Problems:   Celiac disease   PONV (postoperative nausea and vomiting)   Asthma   Hip arthritis   Advance diet Up with therapy Discharge home with home health Dc thurs vs. fri.   Doyle Kunath P 05/08/2015, 12:35 PM   Teryl LucyJoshua Kaydan Wong, MD Cell [phone]

## 2015-05-08 NOTE — Care Management Note (Signed)
Case Management Note  Patient Details  Name: Melinda Guerrero MRN: [id number] Date of Birth: [birth year]  Subjective/Objective:       S/p right total hip arthroplasty             Action/Plan: [facility] MD office.Spoke with patient, no change in discharge plan. Patient states that she will have family available to assist after discharge. Contacted Jermaine at [facility] and requested rolling walker and 3N1 be delivered to room. No other d/c needs identified,will follow until discharge.   Expected Discharge Date:                  Expected Discharge Plan:     In-House Referral:     Discharge planning Services     Post Acute Care Choice:    Choice offered to:     DME Arranged:    DME Agency:     HH Arranged:    HH Agency:     Status of Service:     Medicare Important Message Given:    Date Medicare IM Given:    Medicare IM give by:    Date Additional Medicare IM Given:    Additional Medicare Important Message give by:     If discussed at Long Length of Stay Meetings, dates discussed:    Additional Comments:  Melinda Guerrero, Melinda Poser Watson, RN 05/08/2015, 2:24 PM

## 2015-05-08 NOTE — Discharge Instructions (Signed)
INSTRUCTIONS AFTER JOINT REPLACEMENT  ° °o Remove items at home which could result in a fall. This includes throw rugs or furniture in walking pathways °o ICE to the affected joint every three hours while awake for 30 minutes at a time, for at least the first 3-5 days, and then as needed for pain and swelling.  Continue to use ice for pain and swelling. You may notice swelling that will progress down to the foot and ankle.  This is normal after surgery.  Elevate your leg when you are not up walking on it.   °o Continue to use the breathing machine you got in the hospital (incentive spirometer) which will help keep your temperature down.  It is common for your temperature to cycle up and down following surgery, especially at night when you are not up moving around and exerting yourself.  The breathing machine keeps your lungs expanded and your temperature down. ° ° °DIET:  As you were doing prior to hospitalization, we recommend a well-balanced diet. ° °DRESSING / WOUND CARE / SHOWERING ° °You may change your dressing 3-5 days after surgery.  Then change the dressing every day with sterile gauze.  Please use good hand washing techniques before changing the dressing.  Do not use any lotions or creams on the incision until instructed by your surgeon. ° °ACTIVITY ° °o Increase activity slowly as tolerated, but follow the weight bearing instructions below.   °o No driving for 6 weeks or until further direction given by your physician.  You cannot drive while taking narcotics.  °o No lifting or carrying greater than 10 lbs. until further directed by your surgeon. °o Avoid periods of inactivity such as sitting longer than an hour when not asleep. This helps prevent blood clots.  °o You may return to work once you are authorized by your doctor.  ° ° ° °WEIGHT BEARING  ° °Weight bearing as tolerated with assist device (walker, cane, etc) as directed, use it as long as suggested by your surgeon or therapist, typically at  least 4-6 weeks. ° ° °EXERCISES ° °Results after joint replacement surgery are often greatly improved when you follow the exercise, range of motion and muscle strengthening exercises prescribed by your doctor. Safety measures are also important to protect the joint from further injury. Any time any of these exercises cause you to have increased pain or swelling, decrease what you are doing until you are comfortable again and then slowly increase them. If you have problems or questions, call your caregiver or physical therapist for advice.  ° °Rehabilitation is important following a joint replacement. After just a few days of immobilization, the muscles of the leg can become weakened and shrink (atrophy).  These exercises are designed to build up the tone and strength of the thigh and leg muscles and to improve motion. Often times heat used for twenty to thirty minutes before working out will loosen up your tissues and help with improving the range of motion but do not use heat for the first two weeks following surgery (sometimes heat can increase post-operative swelling).  ° °These exercises can be done on a training (exercise) mat, on the floor, on a table or on a bed. Use whatever works the best and is most comfortable for you.    Use music or television while you are exercising so that the exercises are a pleasant break in your day. This will make your life better with the exercises acting as a break   in your routine that you can look forward to.   Perform all exercises about fifteen times, three times per day or as directed.  You should exercise both the operative leg and the other leg as well. ° °Exercises include: °  °• Quad Sets - Tighten up the muscle on the front of the thigh (Quad) and hold for 5-10 seconds.   °• Straight Leg Raises - With your knee straight (if you were given a brace, keep it on), lift the leg to 60 degrees, hold for 3 seconds, and slowly lower the leg.  Perform this exercise against  resistance later as your leg gets stronger.  °• Leg Slides: Lying on your back, slowly slide your foot toward your buttocks, bending your knee up off the floor (only go as far as is comfortable). Then slowly slide your foot back down until your leg is flat on the floor again.  °• Angel Wings: Lying on your back spread your legs to the side as far apart as you can without causing discomfort.  °• Hamstring Strength:  Lying on your back, push your heel against the floor with your leg straight by tightening up the muscles of your buttocks.  Repeat, but this time bend your knee to a comfortable angle, and push your heel against the floor.  You may put a pillow under the heel to make it more comfortable if necessary.  ° °A rehabilitation program following joint replacement surgery can speed recovery and prevent re-injury in the future due to weakened muscles. Contact your doctor or a physical therapist for more information on knee rehabilitation.  ° ° °CONSTIPATION ° °Constipation is defined medically as fewer than three stools per week and severe constipation as less than one stool per week.  Even if you have a regular bowel pattern at home, your normal regimen is likely to be disrupted due to multiple reasons following surgery.  Combination of anesthesia, postoperative narcotics, change in appetite and fluid intake all can affect your bowels.  ° °YOU MUST use at least one of the following options; they are listed in order of increasing strength to get the job done.  They are all available over the counter, and you may need to use some, POSSIBLY even all of these options:   ° °Drink plenty of fluids (prune juice may be helpful) and high fiber foods °Colace 100 mg by mouth twice a day  °Senokot for constipation as directed and as needed Dulcolax (bisacodyl), take with full glass of water  °Miralax (polyethylene glycol) once or twice a day as needed. ° °If you have tried all these things and are unable to have a bowel  movement in the first 3-4 days after surgery call either your surgeon or your primary doctor.   ° °If you experience loose stools or diarrhea, hold the medications until you stool forms back up.  If your symptoms do not get better within 1 week or if they get worse, check with your doctor.  If you experience "the worst abdominal pain ever" or develop nausea or vomiting, please contact the office immediately for further recommendations for treatment. ° ° °ITCHING:  If you experience itching with your medications, try taking only a single pain pill, or even half a pain pill at a time.  You can also use Benadryl over the counter for itching or also to help with sleep.  ° °TED HOSE STOCKINGS:  Use stockings on both legs until for at least 2 weeks or as   directed by physician office. They may be removed at night for sleeping. ° °MEDICATIONS:  See your medication summary on the “After Visit Summary” that nursing will review with you.  You may have some home medications which will be placed on hold until you complete the course of blood thinner medication.  It is important for you to complete the blood thinner medication as prescribed. ° °PRECAUTIONS:  If you experience chest pain or shortness of breath - call 911 immediately for transfer to the hospital emergency department.  ° °If you develop a fever greater that 101 F, purulent drainage from wound, increased redness or drainage from wound, foul odor from the wound/dressing, or calf pain - CONTACT YOUR SURGEON.   °                                                °FOLLOW-UP APPOINTMENTS:  If you do not already have a post-op appointment, please call the office for an appointment to be seen by your surgeon.  Guidelines for how soon to be seen are listed in your “After Visit Summary”, but are typically between 1-4 weeks after surgery. ° °OTHER INSTRUCTIONS:  ° °Knee Replacement:  Do not place pillow under knee, focus on keeping the knee straight while resting. CPM  instructions: 0-90 degrees, 2 hours in the morning, 2 hours in the afternoon, and 2 hours in the evening. Place foam block, curve side up under heel at all times except when in CPM or when walking.  DO NOT modify, tear, cut, or change the foam block in any way. ° °MAKE SURE YOU:  °• Understand these instructions.  °• Get help right away if you are not doing well or get worse.  ° ° °Thank you for letting us be a part of your medical care team.  It is a privilege we respect greatly.  We hope these instructions will help you stay on track for a fast and full recovery!  ° °Information on my medicine - XARELTO® (Rivaroxaban) ° °This medication education was reviewed with me or my healthcare representative as part of my discharge preparation.  The pharmacist that spoke with me during my hospital stay was:  Luverta Korte G, RPH ° °Why was Xarelto® prescribed for you? °Xarelto® was prescribed for you to reduce the risk of blood clots forming after orthopedic surgery. The medical term for these abnormal blood clots is venous thromboembolism (VTE). ° °What do you need to know about xarelto® ? °Take your Xarelto® ONCE DAILY at the same time every day. °You may take it either with or without food. ° °If you have difficulty swallowing the tablet whole, you may crush it and mix in applesauce just prior to taking your dose. ° °Take Xarelto® exactly as prescribed by your doctor and DO NOT stop taking Xarelto® without talking to the doctor who prescribed the medication.  Stopping without other VTE prevention medication to take the place of Xarelto® may increase your risk of developing a clot. ° °After discharge, you should have regular check-up appointments with your healthcare provider that is prescribing your Xarelto®.   ° °What do you do if you miss a dose? °If you miss a dose, take it as soon as you remember on the same day then continue your regularly scheduled once daily regimen the next day. Do not take two doses of    Xarelto® on the same day.  ° °Important Safety Information °A possible side effect of Xarelto® is bleeding. You should call your healthcare provider right away if you experience any of the following: °? Bleeding from an injury or your nose that does not stop. °? Unusual colored urine (red or dark brown) or unusual colored stools (red or black). °? Unusual bruising for unknown reasons. °? A serious fall or if you hit your head (even if there is no bleeding). ° °Some medicines may interact with Xarelto® and might increase your risk of bleeding while on Xarelto®. To help avoid this, consult your healthcare provider or pharmacist prior to using any new prescription or non-prescription medications, including herbals, vitamins, non-steroidal anti-inflammatory drugs (NSAIDs) and supplements. ° °This website has more information on Xarelto®: www.xarelto.com. ° ° ° °

## 2015-05-08 NOTE — Progress Notes (Signed)
Occupational Therapy Evaluation Patient Details Name: Melinda Guerrero MRN: [id number] DOB: Aug 12, 1971 Today's Date: 05/08/2015    History of Present Illness Pt is a 44 y/o F s/p R THA.  Pt's PMH includes B carpal tunnel release, asthma.   Clinical Impression   Patient presenting with decreased ADL, functional mobility independence. Pt limited by pain, nausea, and dizziness this session (suspect due to medications?). Patient independent PTA. Patient currently requires up to min assist for functional mobility/transfers and is dependent with LB ADLs secondary to posterior hip precautions. Pt will benefit from use of AE.  Patient will benefit from acute OT to increase overall independence in the areas of ADLs, functional mobility, and overall safety in order to safely discharge home with 24/7 supervision from husband who plans to take 2 weeks off from work.     Follow Up Recommendations  No OT follow up;Supervision/Assistance - 24 hour    Equipment Recommendations  3 in 1 bedside comode    Recommendations for Other Services  None at this time    Precautions / Restrictions Precautions Precautions: Fall;Posterior Hip Precaution Booklet Issued: Yes (comment) (posterior hip handout and adaptive equipment handout) Precaution Comments: says she has fallen 3x in the past month (1x in shower and 2x from turning hip the wrong way when walking) Restrictions Weight Bearing Restrictions: Yes RLE Weight Bearing: Weight bearing as tolerated    Mobility Bed Mobility General bed mobility comments: in recliner  Transfers Overall transfer level: Needs assistance Equipment used: Rolling walker (2 wheeled) Transfers: Sit to/from Stand Sit to Stand: Min guard General transfer comment: Min guard for safety.  Cues needed for sequencing and RLE management during sit<>stands    Balance Overall balance assessment: Needs assistance Sitting-balance support: No upper extremity supported;Feet  supported Sitting balance-Leahy Scale: Good     Standing balance support: During functional activity;Bilateral upper extremity supported Standing balance-Leahy Scale: Poor Standing balance comment: relies on RW for support     ADL Overall ADL's : Needs assistance/impaired General ADL Comments: Pt dependent with LB ADLs secondary to posterior hip precautions, adminsterred handout with AE and plan to go over use of AE during next OT session. Patient ambulated <> BR for toilet transfer using BSC. Explained use of BSC over toilet seat and for in walk-in shower. Also plan to practice walk-in shower transfer during next OT session. Pt limited by pain and some nausea & dizziness, suspect due to medications.     Pertinent Vitals/Pain Pain Assessment: No/denies pain Pain Score: 5  Pain Location: right hip with mobility Pain Descriptors / Indicators: Aching Pain Intervention(s): Monitored during session;Limited activity within patient's tolerance;Repositioned     Hand Dominance Right   Extremity/Trunk Assessment Upper Extremity Assessment Upper Extremity Assessment: Overall WFL for tasks assessed   Lower Extremity Assessment Lower Extremity Assessment: Defer to PT evaluation RLE Deficits / Details: weakness and limited ROM as expected s/p R THA RLE Sensation:  (WNL)   Cervical / Trunk Assessment Cervical / Trunk Assessment: Normal   Communication Communication Communication: No difficulties   Cognition Arousal/Alertness: Awake/alert Behavior During Therapy: WFL for tasks assessed/performed Overall Cognitive Status: Within Functional Limits for tasks assessed              Home Living Family/patient expects to be discharged to:: Private residence Living Arrangements: Spouse/significant other;Children (husband plans to take off 2 weeks) Available Help at Discharge: Family;Available 24 hours/day Type of Home: House Home Access: Stairs to enter [employer] of Steps:  2 Entrance Stairs-Rails: Can  reach both Home Layout: One level     Bathroom Shower/Tub: Walk-in shower;Door   Foot LockerBathroom Toilet: Standard     Home Equipment: None          Prior Functioning/Environment Level of Independence: Needs assistance  Gait / Transfers Assistance Needed: Needed assist from husband for sit>stand to power up to standing 2/2 stiffness ADL's / Homemaking Assistance Needed: independent    OT Diagnosis: Generalized weakness;Acute pain   OT Problem List: Decreased strength;Decreased range of motion;Decreased activity tolerance;Impaired balance (sitting and/or standing);Decreased safety awareness;Pain;Decreased knowledge of use of DME or AE;Decreased knowledge of precautions   OT Treatment/Interventions: Self-care/ADL training;Energy conservation;DME and/or AE instruction;Therapeutic activities;Patient/family education;Balance training    OT Goals(Current goals can be found in the care plan section) Acute Rehab OT Goals Patient Stated Goal: get better OT Goal Formulation: With patient Time For Goal Achievement: 05/22/15 Potential to Achieve Goals: Good ADL Goals Pt Will Perform Grooming: with modified independence;standing Pt Will Perform Lower Body Bathing: with modified independence;sit to/from stand;with adaptive equipment Pt Will Perform Lower Body Dressing: with modified independence;sit to/from stand;with adaptive equipment Pt Will Transfer to Toilet: with modified independence;bedside commode;ambulating Pt Will Perform Tub/Shower Transfer: Shower transfer;3 in 1;rolling walker;ambulating;with supervision Additional ADL Goal #1: Pt will independently verbalize and adhere to posterior hip precautions 100% of the time  OT Frequency: Min 2X/week   Barriers to D/C: None known at this time   End of Session Equipment Utilized During Treatment: Rolling walker  Activity Tolerance: Patient limited by pain;Other (comment) (nausea & dizziness, suspect due to  medications?) Patient left: in chair;with call bell/phone within reach   Time: 1032-1059 OT Time Calculation (min): 27 min Charges:  OT General Charges $OT Visit: 1 Procedure OT Evaluation $Initial OT Evaluation Tier I: 1 Procedure OT Treatments $Self Care/Home Management : 8-22 mins  Kree Rafter , MS, OTR/L, CLT Pager: [phone]  05/08/2015, 11:07 AM

## 2015-05-08 NOTE — Evaluation (Signed)
Physical Therapy Evaluation Patient Details Name: Melinda Guerrero MRN: [id number] DOB: 12/09/1970 Today's Date: 05/08/2015   History of Present Illness  Pt is a 44 y/o F s/p R THA.  Pt's PMH includes B carpal tunnel release, asthma.  Clinical Impression  Pt is s/p R THA resulting in the deficits listed below (see PT Problem List). Min guard for sit<>stand and ambulation 25 ft in room.  Cues for techniques to adhere to post hip precautions.  Pt will benefit from skilled PT to increase their independence and safety with mobility to allow discharge to the venue listed below.     Follow Up Recommendations Home health PT;Supervision for mobility/OOB    Equipment Recommendations  Rolling walker with 5" wheels    Recommendations for Other Services OT consult     Precautions / Restrictions Precautions Precautions: Fall;Posterior Hip Precaution Booklet Issued: Yes (comment) Precaution Comments: says she has fallen 3x in the past month (1x in shower and 2x from turning hip the wrong way when walking) Restrictions Weight Bearing Restrictions: Yes RLE Weight Bearing: Weight bearing as tolerated      Mobility  Bed Mobility               General bed mobility comments: in recliner  Transfers Overall transfer level: Needs assistance Equipment used: Rolling walker (2 wheeled) Transfers: Sit to/from Stand Sit to Stand: Min guard         General transfer comment: Min guard for safety.  Cues to push from armrests and to avoid leaning forward too far to adhere to hip precautions during sit>stand and to slide RLE in front during stand>sit.    Ambulation/Gait Ambulation/Gait assistance: Min guard Ambulation Distance (Feet): 25 Feet Assistive device: Rolling walker (2 wheeled) Gait Pattern/deviations: Step-to pattern;Antalgic;Trunk flexed;Decreased weight shift to right;Decreased stance time - right;Decreased stride length Gait velocity: very decreased Gait velocity  interpretation: Below normal speed for age/gender General Gait Details: Slight trunk flexion w/ inc WB through BUEs to offload RLE.  Cues to turn to the L whenever she has the option to prevent R hip IR.    Stairs            Wheelchair Mobility    Modified Rankin (Stroke Patients Only)       Balance Overall balance assessment: Needs assistance Sitting-balance support: Feet supported;Bilateral upper extremity supported Sitting balance-Leahy Scale: Good     Standing balance support: Bilateral upper extremity supported;During functional activity Standing balance-Leahy Scale: Poor Standing balance comment: relies on RW for support                             Pertinent Vitals/Pain Pain Assessment: 0-10 Pain Score: 8  Pain Location: R hip w/ ambulation Pain Descriptors / Indicators: Aching;Grimacing;Jabbing Pain Intervention(s): Limited activity within patient's tolerance;Monitored during session;Repositioned;Patient requesting pain meds-RN notified    Home Living Family/patient expects to be discharged to:: Private residence Living Arrangements: Spouse/significant other;Children (husband and 45 y/o son) Available Help at Discharge: Family;Available 24 hours/day Type of Home: House Home Access: Stairs to enter Entrance Stairs-Rails: Can reach both Entrance Stairs-Number of Steps: 2 Home Layout: One level Home Equipment: None      Prior Function Level of Independence: Needs assistance   Gait / Transfers Assistance Needed: Needed assist from husband for sit>stand to power up to standing 2/2 stiffness  ADL's / Homemaking Assistance Needed: independent        Hand Dominance  Extremity/Trunk Assessment               Lower Extremity Assessment: RLE deficits/detail RLE Deficits / Details: weakness and limited ROM as expected s/p R THA       Communication   Communication: No difficulties  Cognition Arousal/Alertness:  Awake/alert Behavior During Therapy: WFL for tasks assessed/performed Overall Cognitive Status: Within Functional Limits for tasks assessed                      General Comments      Exercises Total Joint Exercises Ankle Circles/Pumps: AROM;Both;15 reps;Seated Quad Sets: AROM;Both;5 reps;Seated Hip ABduction/ADduction: AAROM;Right;10 reps;Seated      Assessment/Plan    PT Assessment Patient needs continued PT services  PT Diagnosis Difficulty walking;Abnormality of gait;Generalized weakness;Acute pain   PT Problem List Decreased strength;Decreased range of motion;Decreased activity tolerance;Decreased balance;Decreased mobility;Decreased coordination;Decreased safety awareness;Decreased knowledge of use of DME;Decreased knowledge of precautions;Pain;Decreased skin integrity  PT Treatment Interventions DME instruction;Gait training;Stair training;Functional mobility training;Therapeutic activities;Therapeutic exercise;Balance training;Neuromuscular re-education;Patient/family education;Modalities   PT Goals (Current goals can be found in the Care Plan section) Acute Rehab PT Goals Patient Stated Goal: to feel better PT Goal Formulation: With patient Time For Goal Achievement: 05/15/15 Potential to Achieve Goals: Good    Frequency 7X/week   Barriers to discharge Inaccessible home environment 2 steps to enter home    Co-evaluation               End of Session Equipment Utilized During Treatment: Gait belt Activity Tolerance: Patient limited by pain Patient left: in chair;with call bell/phone within reach Nurse Communication: Mobility status;Patient requests pain meds;Precautions;Weight bearing status         Time: 0902-0926 PT Time Calculation (min) (ACUTE ONLY): 24 min   Charges:   PT Evaluation $Initial PT Evaluation Tier I: 1 Procedure PT Treatments $Therapeutic Exercise: 8-22 mins   PT G Codes:       Michail Jewels PT, DPT [phone] Pager:  [phone] 05/08/2015, 9:41 AM

## 2015-05-08 NOTE — Progress Notes (Signed)
Physical Therapy Treatment Patient Details Name: Melinda Guerrero MRN: [id number] DOB: 07/31/1971 Today's Date: 05/08/2015    History of Present Illness Pt is a 44 y/o F s/p R THA.  Pt's PMH includes B carpal tunnel release, asthma.    PT Comments    Limited by anxiety and 8/10 pain in R hip.  Pt moves very slowly during ambulation and requires multiple standing rest breaks.  Pt will benefit from continued skilled PT services to increase functional independence and safety.   Follow Up Recommendations  Home health PT;Supervision for mobility/OOB     Equipment Recommendations  Rolling walker with 5" wheels    Recommendations for Other Services       Precautions / Restrictions Precautions Precautions: Fall;Posterior Hip Precaution Comments: Recalled 2/3 precuations (omitting IR). Restrictions Weight Bearing Restrictions: Yes RLE Weight Bearing: Weight bearing as tolerated    Mobility  Bed Mobility Overal bed mobility: Needs Assistance Bed Mobility: Supine to Sit;Sit to Supine     Supine to sit: Min assist Sit to supine: Min assist   General bed mobility comments: Min assist managing RLE into/OOB w/ cues for techniques to adhere to post precuations.  Transfers Overall transfer level: Needs assistance Equipment used: Rolling walker (2 wheeled) Transfers: Sit to/from Stand Sit to Stand: Min guard         General transfer comment: Cues for hand placement and to scoot toward EOB prior to sit>stand.    Ambulation/Gait Ambulation/Gait assistance: Min guard Ambulation Distance (Feet): 25 Feet Assistive device: Rolling walker (2 wheeled) Gait Pattern/deviations: Step-to pattern;Antalgic;Trunk flexed;Decreased stride length;Decreased stance time - right;Decreased weight shift to right Gait velocity: very decreased Gait velocity interpretation: Below normal speed for age/gender General Gait Details: Very dec stance time on the R and inc WB through BUEs to offload R  hip pain.  Pt anxious and requires multiple standing rest breaks in which she was educated in proper breathing technique for relaxation.   Stairs            Wheelchair Mobility    Modified Rankin (Stroke Patients Only)       Balance Overall balance assessment: Needs assistance Sitting-balance support: Feet supported;Bilateral upper extremity supported Sitting balance-Leahy Scale: Good     Standing balance support: Bilateral upper extremity supported;During functional activity Standing balance-Leahy Scale: Poor                      Cognition Arousal/Alertness: Awake/alert Behavior During Therapy: Anxious Overall Cognitive Status: Within Functional Limits for tasks assessed                      Exercises Total Joint Exercises Ankle Circles/Pumps: AROM;Both;15 reps;Supine Quad Sets: AROM;Both;10 reps;Supine    General Comments        Pertinent Vitals/Pain Pain Assessment: 0-10 Pain Score: 8  Pain Location: R hip Pain Descriptors / Indicators: Aching;Grimacing;Jabbing Pain Intervention(s): Limited activity within patient's tolerance;Monitored during session;Repositioned;Patient requesting pain meds-RN notified;RN gave pain meds during session    Home Living                      Prior Function            PT Goals (current goals can now be found in the care plan section) Acute Rehab PT Goals Patient Stated Goal: get better PT Goal Formulation: With patient/family Time For Goal Achievement: 05/15/15 Potential to Achieve Goals: Good Progress towards PT goals: Progressing toward goals  Frequency  7X/week    PT Plan Current plan remains appropriate    Co-evaluation             End of Session Equipment Utilized During Treatment: Gait belt Activity Tolerance: Patient limited by pain Patient left: in bed;with call bell/phone within reach;with family/visitor present;with SCD's reapplied     Time: 1415-1435 PT Time  Calculation (min) (ACUTE ONLY): 20 min  Charges:  $Gait Training: 8-22 mins                    G Codes:      Michail JewelsAshley Parr PT, [place] [phone] Pager: [phone] 05/08/2015, 2:41 PM

## 2015-05-09 ENCOUNTER — Encounter (HOSPITAL_COMMUNITY): Payer: Self-pay | Admitting: Orthopedic Surgery

## 2015-05-09 LAB — BASIC METABOLIC PANEL
Anion gap: 6 (ref 5–15)
BUN: 6 mg/dL (ref 6–20)
CO2: 25 mmol/L (ref 22–32)
Calcium: 8.6 mg/dL — ABNORMAL LOW (ref 8.9–10.3)
Chloride: 106 mmol/L (ref 101–111)
Creatinine, Ser: 0.55 mg/dL (ref 0.44–1.00)
GFR calc Af Amer: >60 mL/min (ref >60)
GFR calc non Af Amer: >60 mL/min (ref >60)
Glucose, Bld: 114 mg/dL — ABNORMAL HIGH (ref 65–99)
Potassium: 4 mmol/L (ref 3.5–5.1)
Sodium: 137 mmol/L (ref 135–145)

## 2015-05-09 LAB — CBC
HEMATOCRIT: 27.4 % — AB (ref 36.0–46.0)
HEMOGLOBIN: 9 g/dL — AB (ref 12.0–15.0)
MCH: 29.7 pg (ref 26.0–34.0)
MCHC: 32.8 g/dL (ref 30.0–36.0)
MCV: 90.4 fL (ref 78.0–100.0)
Platelets: 251 10*3/uL (ref 150–400)
RBC: 3.03 MIL/uL — AB (ref 3.87–5.11)
RDW: 13.8 % (ref 11.5–15.5)
WBC: 10.4 10*3/uL (ref 4.0–10.5)

## 2015-05-09 NOTE — Progress Notes (Signed)
Physical Therapy Treatment Patient Details Name: Melinda Guerrero MRN: [id number] DOB: [birth year] Today's Date: 05/09/2015    History of Present Illness Pt is a 44 y/o F s/p R THA.  Pt's PMH includes B carpal tunnel release, asthma.    PT Comments    Pt w/ improved gait mechanics this session w/ step through gait pattern and dec WB through BUEs.  Standing R knee flexion exercise improved stiffness in R hip.  Pt will benefit from continued skilled PT services to increase functional independence and safety.   Follow Up Recommendations  Home health PT;Supervision for mobility/OOB     Equipment Recommendations  Rolling walker with 5" wheels    Recommendations for Other Services       Precautions / Restrictions Precautions Precautions: Fall;Posterior Hip Precaution Booklet Issued: Yes (comment) Precaution Comments: Recalled 3/3 precuations. Restrictions Weight Bearing Restrictions: Yes RLE Weight Bearing: Weight bearing as tolerated    Mobility  Bed Mobility               General bed mobility comments: in recliner  Transfers Overall transfer level: Needs assistance Equipment used: Rolling walker (2 wheeled) Transfers: Sit to/from Stand Sit to Stand: Min guard         General transfer comment: Good carryover from previous sessions, adhering to hip precautions.  No cues or physical assist needed.  Ambulation/Gait Ambulation/Gait assistance: Min guard Ambulation Distance (Feet): 80 Feet Assistive device: Rolling walker (2 wheeled) Gait Pattern/deviations: Step-to pattern;Step-through pattern;Antalgic;Decreased weight shift to right;Decreased stance time - right;Decreased stride length Gait velocity: dec Gait velocity interpretation: Below normal speed for age/gender General Gait Details: Following VCs pt is able to dec WB through BUE and demonstrates step through gait pattern.  Cues to straighten R knee and activate quad prior to initial contact to prevent R  knee buckle as pt begins to fatigue after 40 ft.     Stairs            Wheelchair Mobility    Modified Rankin (Stroke Patients Only)       Balance Overall balance assessment: Needs assistance Sitting-balance support: No upper extremity supported;Feet supported Sitting balance-Leahy Scale: Good     Standing balance support: Bilateral upper extremity supported;During functional activity Standing balance-Leahy Scale: Poor Standing balance comment: RW for support                    Cognition Arousal/Alertness: Awake/alert Behavior During Therapy: WFL for tasks assessed/performed Overall Cognitive Status: Within Functional Limits for tasks assessed       Memory: Decreased recall of precautions              Exercises Total Joint Exercises Ankle Circles/Pumps: AROM;Both;15 reps;Seated Knee Flexion: AROM;Right;10 reps;Standing (pt reports relief from stiffness w/ this exercise)    General Comments        Pertinent Vitals/Pain Pain Assessment: 0-10 Pain Score: 4  Pain Location: R hip Pain Descriptors / Indicators: Sore Pain Intervention(s): Limited activity within patient's tolerance;Monitored during session;Repositioned;Premedicated before session    Home Living                      Prior Function            PT Goals (current goals can now be found in the care plan section) Acute Rehab PT Goals Patient Stated Goal: get better PT Goal Formulation: With patient/family Time For Goal Achievement: 05/15/15 Potential to Achieve Goals: Good Progress towards PT goals: Progressing toward goals  Frequency  7X/week    PT Plan Current plan remains appropriate    Co-evaluation             End of Session Equipment Utilized During Treatment: Gait belt Activity Tolerance: Patient limited by fatigue Patient left: with call bell/phone within reach;in chair     Time: 1610-9604 PT Time Calculation (min) (ACUTE ONLY): 25  min  Charges:  $Gait Training: 8-22 mins $Therapeutic Exercise: 8-22 mins                    G Codes:      Michail Jewels PT, [place] [phone] Pager: [phone] 05/09/2015, 2:37 PM

## 2015-05-09 NOTE — Care Management Note (Signed)
Case Management Note  Patient Details  Name: Melinda Guerrero MRN: [id number] Date of Birth: 24-Jul-1971  Subjective/Objective:       S/p right total hip arthroplasty             Action/Plan: Set up with [facility] for HHPT by MD office. Spoke with patient, no change in discharge plan. Patient stated that she will have family available to assist after discharge. Spoke with Kipp Brood from [employer], they are delivering 3N1 and rolling walker to the patient's room. No other discharge needs identified. Will follow until discharge.     Expected Discharge Date:                  Expected Discharge Plan:  Home w Home Health Services  In-House Referral:  NA  Discharge planning Services  CM Consult  Post Acute Care Choice:  Durable Medical Equipment, Home Health Choice offered to:  Patient  DME Arranged:  3-N-1, Walker rolling DME Agency:  [employer]  HH Arranged:  PT HH Agency:  [employer]  Status of Service:  Completed, signed off  Medicare Important Message Given:    Date Medicare IM Given:    Medicare IM give by:    Date Additional Medicare IM Given:    Additional Medicare Important Message give by:     If discussed at Long Length of Stay Meetings, dates discussed:    Additional Comments:  Monica Becton, RN 05/09/2015, 10:45 AM

## 2015-05-09 NOTE — Progress Notes (Signed)
Occupational Therapy Treatment Patient Details Name: Melinda Guerrero MRN: [id number] DOB: [birth year] Today's Date: 05/09/2015    History of present illness Pt is a 44 y/o F s/p R THA.  Pt's PMH includes B carpal tunnel release, asthma.   OT comments  Pt progressing towards acute OT goals. Focus of session was AE for LB ADLs. Discussed and demonstrated walk-in shower transfer technique. Pt completed in-room functional mobility at min guard level. D/c plan remains appropriate.  Follow Up Recommendations  No OT follow up;Supervision/Assistance - 24 hour    Equipment Recommendations  3 in 1 bedside comode    Recommendations for Other Services      Precautions / Restrictions Precautions Precautions: Fall;Posterior Hip Precaution Booklet Issued: Yes (comment) Precaution Comments: Recalled 2/3 precuations (omitting IR). Restrictions Weight Bearing Restrictions: Yes RLE Weight Bearing: Weight bearing as tolerated       Mobility Bed Mobility               General bed mobility comments: in recliner  Transfers Overall transfer level: Needs assistance Equipment used: Rolling walker (2 wheeled) Transfers: Sit to/from Stand Sit to Stand: Min guard         General transfer comment: from recliner. cues for hand placement to adhere to post hip precautions    Balance Overall balance assessment: Needs assistance         Standing balance support: Bilateral upper extremity supported;During functional activity Standing balance-Leahy Scale: Poor Standing balance comment: rw for balance                   ADL Overall ADL's : Needs assistance/impaired                                     Functional mobility during ADLs: Min guard;Rolling walker General ADL Comments: Therapist demonstrated AE for LB ADLs with pt indicating understanding of correct use. Pt completed in-room ambulation at min guard level. Discussed shower setup and demonstrated transfer  technique.Pt had narrow shower stall.       Vision                     Perception     Praxis      Cognition   Behavior During Therapy: WFL for tasks assessed/performed Overall Cognitive Status: Within Functional Limits for tasks assessed       Memory: Decreased recall of precautions               Extremity/Trunk Assessment               Exercises     Shoulder Instructions       General Comments      Pertinent Vitals/ Pain       Pain Assessment: 0-10 Pain Score: 6  Pain Location: R hip Pain Descriptors / Indicators: Aching;Grimacing Pain Intervention(s): Limited activity within patient's tolerance;Monitored during session;Repositioned;Patient requesting pain meds-RN notified  Home Living                                          Prior Functioning/Environment              Frequency Min 2X/week     Progress Toward Goals  OT Goals(current goals can now be found in the care plan section)  Progress towards  OT goals: Progressing toward goals  Acute Rehab OT Goals Patient Stated Goal: get better OT Goal Formulation: With patient Time For Goal Achievement: 05/22/15 Potential to Achieve Goals: Good ADL Goals Pt Will Perform Grooming: with modified independence;standing Pt Will Perform Lower Body Bathing: with modified independence;sit to/from stand;with adaptive equipment Pt Will Perform Lower Body Dressing: with modified independence;sit to/from stand;with adaptive equipment Pt Will Transfer to Toilet: with modified independence;bedside commode;ambulating Pt Will Perform Tub/Shower Transfer: Shower transfer;3 in 1;rolling walker;ambulating;with supervision Additional ADL Goal #1: Pt will independently verbalize and adhere to posterior hip precautions 100% of the time  Plan Discharge plan remains appropriate    Co-evaluation                 End of Session Equipment Utilized During Treatment: Rolling walker    Activity Tolerance Patient tolerated treatment well;Patient limited by pain   Patient Left in chair;with call bell/phone within reach   Nurse Communication Patient requests pain meds        Time: 1224-1249 OT Time Calculation (min): 25 min  Charges: OT General Charges $OT Visit: 1 Procedure OT Treatments $Self Care/Home Management : 23-37 mins  Pilar Grammes 05/09/2015, 1:01 PM

## 2015-05-09 NOTE — Progress Notes (Signed)
     Subjective:  Patient reports pain as moderate.  Had difficulty voiding, but this has resolved.  Limited mobility so far.    Objective:   VITALS:   Filed Vitals:   05/08/15 0016 05/08/15 0400 05/08/15 1300 05/08/15 2100  BP: 114/66 114/68 126/60 128/72  Pulse: 66 77 87 82  Temp: 98 F (36.7 C) 98.2 F (36.8 C) 98.1 F (36.7 C) 98.3 F (36.8 C)  TempSrc: Oral Oral  Oral  Resp: Height:      Weight:      SpO2: 100% 100% 100% 100%    Neurologically intact Dorsiflexion/Plantar flexion intact Incision: dressing C/D/I   Lab Results  Component Value Date   WBC 10.4 05/09/2015   HGB 9.0* 05/09/2015   HCT 27.4* 05/09/2015   MCV 90.4 05/09/2015   PLT 251 05/09/2015   BMET    Component Value Date/Time   NA 137 05/09/2015 0337   K 4.0 05/09/2015 0337   CL 106 05/09/2015 0337   CO2 25 05/09/2015 0337   GLUCOSE 114* 05/09/2015 0337   BUN 6 05/09/2015 0337   CREATININE 0.55 05/09/2015 0337   CALCIUM 8.6* 05/09/2015 0337   GFRNONAA >60 05/09/2015 0337   GFRAA >60 05/09/2015 0337     Assessment/Plan: 2 Days Post-Op   Principal Problem:   Osteoarthritis resulting from right hip dysplasia Active Problems:   Celiac disease   PONV (postoperative nausea and vomiting)   Asthma   Hip arthritis   Advance diet Up with therapy Plan for discharge tomorrow Discharge home with home health  ABLA observe.   Melinda Guerrero P 05/09/2015, 9:03 AM   Teryl Lucy, MD Cell [phone]

## 2015-05-09 NOTE — Progress Notes (Signed)
Physical Therapy Treatment Patient Details Name: Melinda Guerrero MRN: [id number] DOB: 1971/06/10 Today's Date: 05/09/2015    History of Present Illness Pt is a 44 y/o F s/p R THA.  Pt's PMH includes B carpal tunnel release, asthma.    PT Comments    Pt demonstrated ability to ambulate 200 ft w/ supervision and ascend/descend 2 steps w/ min guard assist this session.  Melinda Guerrero continues to show progress w/ ambulatory endurance.  Pt will benefit from continued skilled PT services to increase functional independence and safety.   Follow Up Recommendations  Home health PT;Supervision for mobility/OOB     Equipment Recommendations  Rolling walker with 5" wheels    Recommendations for Other Services       Precautions / Restrictions Precautions Precautions: Fall;Posterior Hip Precaution Comments: Recalled 3/3 precuations. Restrictions Weight Bearing Restrictions: Yes RLE Weight Bearing: Weight bearing as tolerated    Mobility  Bed Mobility               General bed mobility comments: in recliner  Transfers Overall transfer level: Needs assistance Equipment used: Rolling walker (2 wheeled) Transfers: Sit to/from Stand Sit to Stand: Supervision         General transfer comment: Good carryover from previous sessions, adhering to hip precautions.  No cues or physical assist needed.  Ambulation/Gait Ambulation/Gait assistance: Supervision Ambulation Distance (Feet): 200 Feet Assistive device: Rolling walker (2 wheeled) Gait Pattern/deviations: Step-through pattern;Antalgic;Decreased stride length;Decreased stance time - right;Decreased weight shift to right Gait velocity: dec Gait velocity interpretation: Below normal speed for age/gender General Gait Details: Step through gait pattern w/ VCs for technique.  Cues to relax shoulders and dec WB through BUEs.     Stairs Stairs: Yes Stairs assistance: Min guard Stair Management: Two rails;Step to  pattern;Forwards Number of Stairs: 2 General stair comments: Demonstration and VCs for technique.  Wheelchair Mobility    Modified Rankin (Stroke Patients Only)       Balance Overall balance assessment: Needs assistance Sitting-balance support: No upper extremity supported;Feet supported Sitting balance-Leahy Scale: Good     Standing balance support: Bilateral upper extremity supported;During functional activity Standing balance-Leahy Scale: Poor Standing balance comment: RW for support, pt continues to WB through BUEs to offload RLE                    Cognition Arousal/Alertness: Awake/alert Behavior During Therapy: WFL for tasks assessed/performed Overall Cognitive Status: Within Functional Limits for tasks assessed                      Exercises Total Joint Exercises Ankle Circles/Pumps: AROM;Both;15 reps;Seated Knee Flexion: AROM;Right;Standing;20 reps (pt reports relief from stiffness w/ this exercise)    General Comments General comments (skin integrity, edema, etc.): Pt's husband reports that he has built pt a ramp at home so she will not have to go up/down the steps.        Pertinent Vitals/Pain Pain Assessment: 0-10 Pain Score: 5  Pain Location: R hip Pain Descriptors / Indicators: Tightness Pain Intervention(s): Limited activity within patient's tolerance;Monitored during session;Repositioned    Home Living                      Prior Function            PT Goals (current goals can now be found in the care plan section) Acute Rehab PT Goals Patient Stated Goal: get stronger  PT Goal Formulation: With patient/family Time  For Goal Achievement: 05/15/15 Potential to Achieve Goals: Good Progress towards PT goals: Progressing toward goals    Frequency  7X/week    PT Plan Current plan remains appropriate    Co-evaluation             End of Session Equipment Utilized During Treatment: Gait belt Activity Tolerance:  Patient tolerated treatment well Patient left: in chair;with call bell/phone within reach;with family/visitor present     Time: 0454-0981 PT Time Calculation (min) (ACUTE ONLY): 37 min  Charges:  $Gait Training: 23-37 mins $Therapeutic Exercise: 8-22 mins                    G Codes:      Michail Jewels PT, [place] [phone] Pager: [phone] 05/09/2015, 4:08 PM

## 2015-05-10 LAB — CBC
HCT: 25.3 % — ABNORMAL LOW (ref 36.0–46.0)
Hemoglobin: 8.4 g/dL — ABNORMAL LOW (ref 12.0–15.0)
MCH: 29.8 pg (ref 26.0–34.0)
MCHC: 33.2 g/dL (ref 30.0–36.0)
MCV: 89.7 fL (ref 78.0–100.0)
Platelets: 239 10*3/uL (ref 150–400)
RBC: 2.82 MIL/uL — AB (ref 3.87–5.11)
RDW: 13.6 % (ref 11.5–15.5)
WBC: 8 10*3/uL (ref 4.0–10.5)

## 2015-05-10 NOTE — Progress Notes (Signed)
Pt ready for d/c per MD. Cleared by PT and OT, equipment delivered to room previously. Prescription and discharge teaching given to pt, all questions answered. Peripheral IV removed. Belongings gathered, waiting for husband to transport pt home.   Melinda Guerrero 05/10/2015 11:42 AM

## 2015-05-10 NOTE — Progress Notes (Signed)
Physical Therapy Treatment Patient Details Name: Melinda Guerrero MRN: [id number] DOB: Mar 12, 1971 Today's Date: 05/10/2015    History of Present Illness Pt is a 44 y/o F s/p R THA.  Pt's PMH includes B carpal tunnel release, asthma.    PT Comments    Patient complaining of increased pain this morning. Describes having muscle spasms during the night. Still feeling like she will be able to D/C home. Ambulation distance decreased today due to increased pain. Able to review HEP as patient able to tolerate. Handout provided with HEP and precautions. Patient denies any questions or concerns.    Follow Up Recommendations  Home health PT     Equipment Recommendations  Rolling walker with 5" wheels    Recommendations for Other Services       Precautions / Restrictions Precautions Precautions: Fall;Posterior Hip Precaution Booklet Issued: Yes (comment) Precaution Comments: Recalled 3/3 precuations. Restrictions Weight Bearing Restrictions: Yes RLE Weight Bearing: Weight bearing as tolerated    Mobility  Bed Mobility                  Transfers Overall transfer level: Needs assistance Equipment used: Rolling walker (2 wheeled) Transfers: Sit to/from Stand Sit to Stand: Supervision            Ambulation/Gait Ambulation/Gait assistance: Supervision Ambulation Distance (Feet): 50 Feet Assistive device: Rolling walker (2 wheeled) Gait Pattern/deviations: Step-to pattern;Decreased step length - right;Decreased stance time - right;Decreased weight shift to right   Gait velocity interpretation: Below normal speed for age/gender     Stairs         General stair comments: patient declined, reports feeling confident with stairs after session yesterday.l  Wheelchair Mobility    Modified Rankin (Stroke Patients Only)       Balance Overall balance assessment: Needs assistance Sitting-balance support: No upper extremity supported Sitting balance-Leahy Scale:  Good     Standing balance support: Bilateral upper extremity supported Standing balance-Leahy Scale: Poor                      Cognition Arousal/Alertness: Awake/alert Behavior During Therapy: WFL for tasks assessed/performed Overall Cognitive Status: Within Functional Limits for tasks assessed                      Exercises Total Joint Exercises Ankle Circles/Pumps: AROM;Both;10 reps Quad Sets: Strengthening;Right;5 reps Heel Slides: 5 reps;AAROM;Right Other Exercises Other Exercises: Reviewed THA HEP, patient unable to perform all exercises due to complaints of muscle spasms.     General Comments        Pertinent Vitals/Pain Pain Assessment: 0-10 Pain Score: 8  Pain Location: Rt hip and thigh Pain Descriptors / Indicators: Aching;Spasm Pain Intervention(s): Monitored during session;Limited activity within patient's tolerance;Repositioned    Home Living                      Prior Function            PT Goals (current goals can now be found in the care plan section) Acute Rehab PT Goals Patient Stated Goal: get stronger  PT Goal Formulation: With patient/family Time For Goal Achievement: 05/15/15 Potential to Achieve Goals: Good Progress towards PT goals: Progressing toward goals    Frequency  7X/week    PT Plan Current plan remains appropriate    Co-evaluation             End of Session Equipment Utilized During Treatment: Gait belt Activity  Tolerance: Patient limited by pain Patient left: in chair;with call bell/phone within reach     Time: 0815-0843 PT Time Calculation (min) (ACUTE ONLY): 28 min  Charges:  $Gait Training: 8-22 mins $Therapeutic Exercise: 8-22 mins                    G Codes:      Christiane Ha, PT, CSCS Pager [phone] Office [phone]  05/10/2015, 10:20 AM

## 2015-05-10 NOTE — Progress Notes (Signed)
Physical Therapy Treatment Patient Details Name: Melinda Guerrero MRN: [id number] DOB: [birth year] Today's Date: 05/10/2015    History of Present Illness Pt is a 44 y/o F s/p R THA.  Pt's PMH includes B carpal tunnel release, asthma.    PT Comments    Patient is making good progress with PT.  From a mobility standpoint anticipate patient will be ready for DC home. Reviewed use of walker and adjusted personal walker to correct height. Patient confirms that she feels confident with stairs and HEP. Arrangements for Home PT have been arranged per patient report.      Follow Up Recommendations  Home health PT     Equipment Recommendations  Rolling walker with 5" wheels    Recommendations for Other Services       Precautions / Restrictions Precautions Precautions: Fall;Posterior Hip Precaution Booklet Issued: Yes (comment) Precaution Comments: Recalled 3/3 precuations. Restrictions Weight Bearing Restrictions: Yes RLE Weight Bearing: Weight bearing as tolerated    Mobility  Bed Mobility                  Transfers Overall transfer level: Needs assistance Equipment used: Rolling walker (2 wheeled) Transfers: Sit to/from Stand Sit to Stand: Supervision         General transfer comment: consistent with precautions  Ambulation/Gait Ambulation/Gait assistance: Supervision Ambulation Distance (Feet): 25 Feet Assistive device: Rolling walker (2 wheeled) Gait Pattern/deviations: Decreased stance time - right;Decreased weight shift to right   Gait velocity interpretation: Below normal speed for age/gender General Gait Details: partial weight bearing on rt due to reports of pain.   Stairs         General stair comments: patient declined, reports feeling confident with stairs after session yesterday.l  Wheelchair Mobility    Modified Rankin (Stroke Patients Only)       Balance Overall balance assessment: Needs assistance Sitting-balance support: No  upper extremity supported Sitting balance-Leahy Scale: Good     Standing balance support: No upper extremity supported Standing balance-Leahy Scale: Fair Standing balance comment: able to stand without upper extremity support, static                    Cognition Arousal/Alertness: Awake/alert Behavior During Therapy: WFL for tasks assessed/performed Overall Cognitive Status: Within Functional Limits for tasks assessed      General Comments        Pertinent Vitals/Pain Pain Assessment: 0-10 Pain Score: 6  Pain Location: Rt hip Pain Descriptors / Indicators: Aching Pain Intervention(s): Monitored during session    Home Living                      Prior Function            PT Goals (current goals can now be found in the care plan section) Acute Rehab PT Goals Patient Stated Goal: Get home today PT Goal Formulation: With patient/family Time For Goal Achievement: 05/15/15 Potential to Achieve Goals: Good Progress towards PT goals: Progressing toward goals    Frequency  7X/week    PT Plan Current plan remains appropriate    Co-evaluation             End of Session Equipment Utilized During Treatment: Gait belt Activity Tolerance: Patient limited by fatigue Patient left: with family/visitor present;with call bell/phone within reach (sitting edge of bed)     Time: 4259-5638 PT Time Calculation (min) (ACUTE ONLY): 10 min  Charges:  $Gait Training: 8-22 mins $Therapeutic Exercise: 8-22  mins                    G Codes:      Christiane Ha, PT, CSCS Pager [phone] Office [phone]  05/10/2015, 1:56 PM

## 2015-05-10 NOTE — Discharge Summary (Signed)
Physician Discharge Summary  Patient ID: Melinda Guerrero MRN: [id number] DOB/AGE: 45-May-1972 45 y.o.  Admit date: 05/07/2015 Discharge date: 05/10/2015  Admission Diagnoses:  Osteoarthritis resulting from right hip dysplasia  Discharge Diagnoses:  Principal Problem:   Osteoarthritis resulting from right hip dysplasia Active Problems:   Celiac disease   PONV (postoperative nausea and vomiting)   Asthma   Hip arthritis ABLA, observed  Past Medical History  Diagnosis Date  . Asthma   . Pneumonia   . Arthritis     left hand after carpal tunnel release  . Celiac disease   . PONV (postoperative nausea and vomiting)   . Osteoarthritis resulting from right hip dysplasia 05/07/2015    Surgeries: Procedure(s): TOTAL HIP ARTHROPLASTY on 05/07/2015   Consultants (if any):    Discharged Condition: Improved  Hospital Course: Melinda Guerrero is an 44 y.o. female who was admitted 05/07/2015 with a diagnosis of Osteoarthritis resulting from right hip dysplasia and went to the operating room on 05/07/2015 and underwent the above named procedures.    She was given perioperative antibiotics:  Anti-infectives    Start     Dose/Rate Route Frequency Ordered Stop   05/07/15 1730  ceFAZolin (ANCEF) IVPB 2 g/50 mL premix     2 g 100 mL/hr over 30 Minutes Intravenous Every 6 hours 05/07/15 1717 05/07/15 2249   05/07/15 1720  ceFAZolin (ANCEF) 2-3 GM-% IVPB SOLR    Comments:  Reynolds, Lauren   : cabinet override      05/07/15 1720 05/08/15 0529   05/07/15 1000  ceFAZolin (ANCEF) IVPB 2 g/50 mL premix     2 g 100 mL/hr over 30 Minutes Intravenous To [facility]Stay [facility] 05/06/15 1223 05/07/15 1105    .  She was given sequential compression devices, early ambulation, and xarelto for DVT prophylaxis.  She benefited maximally from the hospital stay and there were no complications.    Recent vital signs:  Filed Vitals:   05/10/15 0539  BP: 113/57  Pulse: 77  Temp: 97.3 F (36.3 C)   Resp: 16    Recent laboratory studies:  Lab Results  Component Value Date   HGB 8.4* 05/10/2015   HGB 9.0* 05/09/2015   HGB 9.9* 05/08/2015   Lab Results  Component Value Date   WBC 8.0 05/10/2015   PLT 239 05/10/2015   No results found for: INR Lab Results  Component Value Date   NA 137 05/09/2015   K 4.0 05/09/2015   CL 106 05/09/2015   CO2 25 05/09/2015   BUN 6 05/09/2015   CREATININE 0.55 05/09/2015   GLUCOSE 114* 05/09/2015    Discharge Medications:     Medication List    STOP taking these medications        oxyCODONE 5 MG immediate release tablet  Commonly known as:  Oxy IR/ROXICODONE      TAKE these medications        baclofen 10 MG tablet  Commonly known as:  LIORESAL  Take 1 tablet (10 mg total) by mouth 3 (three) times daily. As needed for muscle spasm     ondansetron 4 MG tablet  Commonly known as:  ZOFRAN  Take 1 tablet (4 mg total) by mouth every 8 (eight) hours as needed for nausea or vomiting.     oxyCODONE-acetaminophen 10-325 MG per tablet  Commonly known as:  PERCOCET  Take 1-2 tablets by mouth every 6 (six) hours as needed for pain. MAXIMUM TOTAL ACETAMINOPHEN DOSE IS 4000 MG PER  DAY     rivaroxaban 10 MG Tabs tablet  Commonly known as:  XARELTO  Take 1 tablet (10 mg total) by mouth daily.     sennosides-docusate sodium 8.6-50 MG tablet  Commonly known as:  SENOKOT-S  Take 2 tablets by mouth daily.     SYMBICORT 160-4.5 MCG/ACT inhaler  Generic drug:  budesonide-formoterol  Inhale 1 puff into the lungs daily as needed (shortness of breath).        Diagnostic Studies: Dg Pelvis Portable  05/07/2015   CLINICAL DATA:  44 year old female post total hip replacement. History of osteoarthritis and right hip dysplasia. Initial encounter.  EXAM: PORTABLE PELVIS 1-2 VIEWS  COMPARISON:  07/09/2014.  FINDINGS: Post total right hip replacement. The full extent is not seen on this frontal projection. The portion which is visualize appears  unremarkable.  IUD is in place.  IMPRESSION: Post total right hip replacement. The full extent is not seen on this frontal projection. The portion which is visualize appears unremarkable.   Electronically Signed   By: Lacy Duverney M.D.   On: 05/07/2015 14:44    Disposition: home        Follow-up Information    Follow up with Eulas Post, MD. Schedule an appointment as soon as possible for a visit in 2 weeks.   Specialty:  Orthopedic Surgery   Contact information:   [address] [place] [address] [phone]       Follow up with [facility].   Why:  They will contact you to schedule home therapy visits.   Contact information:   [address] [place] [address] [phone]        Signed: Eulas Post 05/10/2015, 7:40 AM

## 2015-07-17 ENCOUNTER — Other Ambulatory Visit: Payer: Self-pay

## 2015-07-17 DIAGNOSIS — Z1231 Encounter for screening mammogram for malignant neoplasm of breast: Secondary | ICD-10-CM

## 2015-07-30 ENCOUNTER — Ambulatory Visit
Admission: RE | Admit: 2015-07-30 | Discharge: 2015-07-30 | Disposition: A | Discharge status: Home / Self Care | Payer: PRIVATE HEALTH INSURANCE | Source: Ambulatory Visit

## 2015-07-30 DIAGNOSIS — Z1231 Encounter for screening mammogram for malignant neoplasm of breast: Secondary | ICD-10-CM

## 2016-01-29 ENCOUNTER — Encounter (HOSPITAL_COMMUNITY): Payer: Self-pay | Admitting: Emergency Medicine

## 2016-01-29 ENCOUNTER — Emergency Department (HOSPITAL_COMMUNITY): Payer: PRIVATE HEALTH INSURANCE

## 2016-01-29 ENCOUNTER — Emergency Department (HOSPITAL_COMMUNITY)
Admission: EM | Admit: 2016-01-29 | Discharge: 2016-01-30 | Disposition: A | Discharge status: Home / Self Care | Payer: PRIVATE HEALTH INSURANCE | Attending: Emergency Medicine | Admitting: Emergency Medicine

## 2016-01-29 DIAGNOSIS — R079 Chest pain, unspecified: Secondary | ICD-10-CM | POA: Diagnosis present

## 2016-01-29 DIAGNOSIS — Z79899 Other long term (current) drug therapy: Secondary | ICD-10-CM | POA: Insufficient documentation

## 2016-01-29 DIAGNOSIS — M19042 Primary osteoarthritis, left hand: Secondary | ICD-10-CM | POA: Insufficient documentation

## 2016-01-29 DIAGNOSIS — J45901 Unspecified asthma with (acute) exacerbation: Secondary | ICD-10-CM | POA: Insufficient documentation

## 2016-01-29 DIAGNOSIS — Z7901 Long term (current) use of anticoagulants: Secondary | ICD-10-CM | POA: Insufficient documentation

## 2016-01-29 DIAGNOSIS — Z8719 Personal history of other diseases of the digestive system: Secondary | ICD-10-CM | POA: Insufficient documentation

## 2016-01-29 DIAGNOSIS — Z792 Long term (current) use of antibiotics: Secondary | ICD-10-CM | POA: Diagnosis not present

## 2016-01-29 DIAGNOSIS — R091 Pleurisy: Secondary | ICD-10-CM

## 2016-01-29 DIAGNOSIS — Z8701 Personal history of pneumonia (recurrent): Secondary | ICD-10-CM | POA: Diagnosis not present

## 2016-01-29 DIAGNOSIS — Z7951 Long term (current) use of inhaled steroids: Secondary | ICD-10-CM | POA: Insufficient documentation

## 2016-01-29 LAB — CBC
HCT: 40.5 % (ref 36.0–46.0)
Hemoglobin: 14.1 g/dL (ref 12.0–15.0)
MCH: 29.4 pg (ref 26.0–34.0)
MCHC: 34.8 g/dL (ref 30.0–36.0)
MCV: 84.4 fL (ref 78.0–100.0)
Platelets: 312 10*3/uL (ref 150–400)
RBC: 4.8 MIL/uL (ref 3.87–5.11)
RDW: 13.5 % (ref 11.5–15.5)
WBC: 11.2 10*3/uL — ABNORMAL HIGH (ref 4.0–10.5)

## 2016-01-29 NOTE — ED Notes (Signed)
Pt states that she has been sick with early PNA but tonight became more SOB with Left upper chest pain. Hx of blood clot this past fall. Was off of blood thinners for 3-4 days recently. Alert and oriented.

## 2016-01-29 NOTE — ED Provider Notes (Signed)
CSN: [id number]     Arrival date & time 01/29/16  2236 History   By signing my name below, I, Arlan Organ, attest that this documentation has been prepared under the direction and in the presence of Gilda Crease, MD.  Electronically Signed: Arlan Organ, ED Scribe. 01/29/2016. 11:38 PM.   Chief Complaint  Patient presents with  . Chest Pain  . Shortness of Breath   The history is provided by the patient. No language interpreter was used.    HPI Comments: Melinda Guerrero is a 45 y.o. female who presents to the Emergency Department complaining of constant, ongoing, worsening L upper chest pain x 1 day. Pain is described as pressure. She also reports ongoing shortness of breath. No aggravating or alleviating factors reported. Pt states 2 blood clots were found in her L lung in November of 2016 and states current symptoms feel similar to what she initially experienced in November. Currently she is on Xarelto. However, she admits to missing 3 doses as she could not afford her co-pay. Pt restarted her medication today with last dose taken on Friday 4/7. Pt was started on Prednisone and Moxifloxacin for PNA diagnosed on 4/7. No recent fever or chills. Pt with known allergy to Iodine.  PCP: No primary care provider on file.    Past Medical History  Diagnosis Date  . Asthma   . Pneumonia   . Arthritis     left hand after carpal tunnel release  . Celiac disease   . PONV (postoperative nausea and vomiting)   . Osteoarthritis resulting from right hip dysplasia 05/07/2015   Past Surgical History  Procedure Laterality Date  . Carpal tunnel release Left   . Carpal tunnel release Right   . Left leg surgery      tendons and nerves removed to use to rebuild hand  . Breast surgery      breast biopsy (3 clips on right breast)  . Total hip arthroplasty Right 05/07/2015    Procedure: TOTAL HIP ARTHROPLASTY;  Surgeon: Teryl Lucy, MD;  Location: [facility] OR;  Service: Orthopedics;  Laterality:  Right;   Family History  Problem Relation Age of Onset  . Hypertension Mother   . Glaucoma Mother   . Heart disease Mother   . Lung cancer Father    Social History  Substance Use Topics  . Smoking status: Never Smoker   . Smokeless tobacco: Never Used  . Alcohol Use: No   OB History    No data available     Review of Systems  Constitutional: Negative for fever and chills.  Respiratory: Positive for shortness of breath.   Cardiovascular: Positive for chest pain.  Gastrointestinal: Negative for nausea, vomiting and abdominal pain.  Neurological: Negative for headaches.  Psychiatric/Behavioral: Negative for confusion.  All other systems reviewed and are negative.     Allergies  Eggs or egg-derived products; Iodine; Other; and Shellfish allergy  Home Medications   Prior to Admission medications   Medication Sig Start Date End Date Taking? Authorizing Provider  eszopiclone (LUNESTA) 1 MG TABS tablet Take 1 mg by mouth at bedtime. 12/31/15  Yes Historical Provider, MD  fluticasone (FLONASE) 50 MCG/ACT nasal spray Place 1 spray into both nostrils 2 (two) times daily as needed. 12/26/15  Yes Historical Provider, MD  ipratropium (ATROVENT) 0.02 % nebulizer solution Take 0.5 mg by nebulization every 6 (six) hours as needed for wheezing or shortness of breath.   Yes Historical Provider, MD  levocetirizine (XYZAL) 5  MG tablet Take 5 mg by mouth once a week. 12/30/15  Yes Historical Provider, MD  LORazepam (ATIVAN) 0.5 MG tablet Take 0.5 mg by mouth 2 (two) times daily as needed for anxiety.  11/04/15  Yes Historical Provider, MD  montelukast (SINGULAIR) 10 MG tablet Take 10 mg by mouth daily. 01/27/16  Yes Historical Provider, MD  moxifloxacin (AVELOX) 400 MG tablet Take 400 mg by mouth daily. 01/24/16 02/03/16 Yes Historical Provider, MD  PARoxetine (PAXIL) 40 MG tablet Take 40 mg by mouth daily. 01/27/16  Yes Historical Provider, MD  predniSONE (DELTASONE) 10 MG tablet Take 10 mg by mouth  See admin instructions. TAKE 6 TABLETS BY MOUTH  ON DAY 1, 5 TABLETS ON DAY 2, 4 TABLETS ON DAY 3, 3 TABLETS ON DAY 4, 2 TABLETS ON DAY 5, AND 1 TABLET ON DAY 6. 01/24/16  Yes Historical Provider, MD  SYMBICORT 160-4.5 MCG/ACT inhaler Inhale 1 puff into the lungs daily as needed (shortness of breath).  01/25/15  Yes Historical Provider, MD  Vitamin D, Ergocalciferol, (DRISDOL) 50000 units CAPS capsule Take 50,000 Units by mouth once a week. Friday 12/26/15  Yes Historical Provider, MD  XARELTO 20 MG TABS tablet Take 20 mg by mouth daily. 01/27/16  Yes Historical Provider, MD  rivaroxaban (XARELTO) 10 MG TABS tablet Take 1 tablet (10 mg total) by mouth daily. Patient not taking: Reported on 01/29/2016 05/07/15   Teryl LucyJoshua Landau, MD   Triage Vitals: BP 123/78 mmHg  Pulse 85  Temp(Src) 98 F (36.7 C) (Oral)  Resp 19  SpO2 97%   Physical Exam  Constitutional: She is oriented to person, place, and time. She appears well-developed and well-nourished. No distress.  HENT:  Head: Normocephalic and atraumatic.  Right Ear: Hearing normal.  Left Ear: Hearing normal.  Nose: Nose normal.  Mouth/Throat: Oropharynx is clear and moist and mucous membranes are normal.  Eyes: Conjunctivae and EOM are normal. Pupils are equal, round, and reactive to light.  Neck: Normal range of motion. Neck supple.  Cardiovascular: Normal rate, regular rhythm, S1 normal, S2 normal and normal heart sounds.  Exam reveals no gallop and no friction rub.   No murmur heard. Pulmonary/Chest: Effort normal. No respiratory distress. She exhibits no tenderness.  Bilateral diminished breath sounds noted; R greater than L  Abdominal: Soft. Normal appearance and bowel sounds are normal. There is no hepatosplenomegaly. There is no tenderness. There is no rebound, no guarding, no tenderness at McBurney's point and negative Murphy's sign. No hernia.  Musculoskeletal: Normal range of motion.  Neurological: She is alert and oriented to person, place,  and time. She has normal strength. No cranial nerve deficit or sensory deficit. Coordination normal. GCS eye subscore is 4. GCS verbal subscore is 5. GCS motor subscore is 6.  Skin: Skin is warm, dry and intact. No rash noted. No cyanosis.  Psychiatric: She has a normal mood and affect. Her speech is normal and behavior is normal. Thought content normal.  Nursing note and vitals reviewed.   ED Course  Procedures (including critical care time)  DIAGNOSTIC STUDIES: Oxygen Saturation is 100% on RA, Normal by my interpretation.    COORDINATION OF CARE: 11:29 PM- Will order blood work, CXR, and EKG. Discussed treatment plan with pt at bedside and pt agreed to plan.     Labs Review Labs Reviewed  CBC - Abnormal; Notable for the following:    WBC 11.2 (*)    All other components within normal limits  BASIC METABOLIC PANEL  Rosezena Sensor, ED    Imaging Review Dg Chest 2 View  01/29/2016  CLINICAL DATA:  Chest pain and shortness of breath EXAM: CHEST  2 VIEW COMPARISON:  August 20, 2015 FINDINGS: The lungs are clear. The heart size and pulmonary vascularity are normal. No adenopathy. No bone lesions. No pneumothorax. IMPRESSION: No edema or consolidation. Electronically Signed   By: Bretta Bang III M.D.   On: 01/29/2016 23:32   I have personally reviewed and evaluated these images and lab results as part of my medical decision-making.   EKG Interpretation   Date/Time:  Wednesday January 29 2016 22:45:01 EDT Ventricular Rate:  92 PR Interval:  126 QRS Duration: 117 QT Interval:  355 QTC Calculation: 439 R Axis:   78 Text Interpretation:  Sinus rhythm Nonspecific intraventricular conduction  delay Minimal ST elevation, lateral leads Baseline wander in lead(s) V6  Confirmed by Freida Busman  MD, ANTHONY ([id number]) on 01/29/2016 10:52:14 PM      MDM   Final diagnoses:  None  Pleurisy  Patient presents to the emergency part for evaluation of chest pain. Patient was recently  diagnosed with pneumonia and treated with moxifloxacin. She reports that she is still experiencing shortness of breath and expressing pain in the left side of her chest. Chest x-ray is clear, no evidence of pneumonia currently. It appears that her pneumonia has resolved. Symptoms are most likely secondary to inflammatory changes, possibly postinfectious pleurisy. She does have a history of PE. She was off Xarelto for 3 days, but has her prescription now and has restarted it. Her d-dimer is negative. She has not hypoxic. If she did have a small PE, treatment would simply be restarting Xarelto. There is no concern for large PE based on her symptomatology, examination and vital signs. With a normal d-dimer, I am reassured that she does not have large PE and she is safe for discharge with analgesia.  I personally performed the services described in this documentation, which was scribed in my presence. The recorded information has been reviewed and is accurate.   Gilda Crease, MD 01/30/16 [phone]

## 2016-01-29 NOTE — ED Notes (Signed)
MD at bedside. 

## 2016-01-30 LAB — BASIC METABOLIC PANEL
Anion gap: 13 (ref 5–15)
BUN: 22 mg/dL — AB (ref 6–20)
CALCIUM: 9.1 mg/dL (ref 8.9–10.3)
CO2: 22 mmol/L (ref 22–32)
CREATININE: 0.66 mg/dL (ref 0.44–1.00)
Chloride: 105 mmol/L (ref 101–111)
GFR calc Af Amer: >60 mL/min (ref >60)
GFR calc non Af Amer: >60 mL/min (ref >60)
Glucose, Bld: 104 mg/dL — ABNORMAL HIGH (ref 65–99)
Potassium: 4.4 mmol/L (ref 3.5–5.1)
Sodium: 140 mmol/L (ref 135–145)

## 2016-01-30 LAB — D-DIMER, QUANTITATIVE (NOT AT ARMC)

## 2016-01-30 LAB — I-STAT TROPONIN, ED: Troponin i, poc: 0 ng/mL (ref 0.00–0.08)

## 2016-01-30 MED ORDER — HYDROCODONE-ACETAMINOPHEN 5-325 MG PO TABS: 1.0000 | ORAL_TABLET | ORAL | Status: DC | PRN

## 2016-01-30 MED ORDER — ONDANSETRON HCL 4 MG/2ML IJ SOLN
4.0000 mg | Freq: Once | INTRAMUSCULAR | Status: AC
  Administered 2016-01-30: 4 mg via INTRAVENOUS
  Filled 2016-01-30: qty 2

## 2016-01-30 MED ORDER — MORPHINE SULFATE (PF) 4 MG/ML IV SOLN
4.0000 mg | Freq: Once | INTRAVENOUS | Status: AC
  Administered 2016-01-30: 4 mg via INTRAVENOUS
  Filled 2016-01-30: qty 1

## 2016-01-30 NOTE — ED Notes (Signed)
Pt stated "my inhalers won't work.  I'm fine when I sitting and talking but when I get up, the pain gets worse.  I was dx'd with PNA last Friday."

## 2016-01-30 NOTE — ED Notes (Signed)
Pt informed MD has cx'd CT angio & ordered a D-dimer, which is a lab & lab has confirmed blood is in the lab.

## 2016-01-30 NOTE — Discharge Instructions (Signed)
Pleurisy  Pleurisy is an inflammation and swelling of the lining of the lungs (pleura). Because of this inflammation, it hurts to breathe. It can be aggravated by coughing, laughing, or deep breathing. Pleurisy is often caused by an underlying infection or disease.   HOME CARE INSTRUCTIONS   Monitor your pleurisy for any changes. The following actions may help to alleviate any discomfort you are experiencing:  · Medicine may help with pain. Only take over-the-counter or prescription medicines for pain, discomfort, or fever as directed by your health care provider.  · Only take antibiotic medicine as directed. Make sure to finish it even if you start to feel better.  SEEK MEDICAL CARE IF:   · Your pain is not controlled with medicine or is increasing.  · You have an increase in pus-like (purulent) secretions brought up with coughing.  SEEK IMMEDIATE MEDICAL CARE IF:   · You have blue or dark lips, fingernails, or toenails.  · You are coughing up blood.  · You have increased difficulty breathing.  · You have continuing pain unrelieved by medicine or pain lasting more than 1 week.  · You have pain that radiates into your neck, arms, or jaw.  · You develop increased shortness of breath or wheezing.  · You develop a fever, rash, vomiting, fainting, or other serious symptoms.  MAKE SURE YOU:  · Understand these instructions.    · Will watch your condition.    · Will get help right away if you are not doing well or get worse.        This information is not intended to replace advice given to you by your health care provider. Make sure you discuss any questions you have with your health care provider.     Document Released: 10/05/2005 Document Revised: 06/07/2013 Document Reviewed: 03/19/2013  Elsevier Interactive Patient Education ©2016 [employer]

## 2016-01-30 NOTE — ED Notes (Signed)
Pt informed of orders placed for CT angio.  Pt stated "If I have contrast, I need to have benadryl.  The last time I had hives."

## 2016-04-30 ENCOUNTER — Other Ambulatory Visit: Payer: Self-pay | Admitting: Obstetrics and Gynecology

## 2016-04-30 DIAGNOSIS — Z1231 Encounter for screening mammogram for malignant neoplasm of breast: Secondary | ICD-10-CM

## 2016-07-31 ENCOUNTER — Ambulatory Visit
Admission: RE | Admit: 2016-07-31 | Discharge: 2016-07-31 | Disposition: A | Discharge status: Home / Self Care | Payer: PRIVATE HEALTH INSURANCE | Source: Ambulatory Visit | Attending: Obstetrics and Gynecology | Admitting: Obstetrics and Gynecology

## 2016-07-31 DIAGNOSIS — Z1231 Encounter for screening mammogram for malignant neoplasm of breast: Secondary | ICD-10-CM

## 2016-08-23 DIAGNOSIS — R0902 Hypoxemia: Secondary | ICD-10-CM

## 2016-08-23 DIAGNOSIS — J45901 Unspecified asthma with (acute) exacerbation: Secondary | ICD-10-CM

## 2016-08-24 DIAGNOSIS — J45901 Unspecified asthma with (acute) exacerbation: Secondary | ICD-10-CM | POA: Diagnosis not present

## 2016-08-24 DIAGNOSIS — R0902 Hypoxemia: Secondary | ICD-10-CM | POA: Diagnosis not present

## 2016-08-25 DIAGNOSIS — J45901 Unspecified asthma with (acute) exacerbation: Secondary | ICD-10-CM | POA: Diagnosis not present

## 2016-08-25 DIAGNOSIS — R0902 Hypoxemia: Secondary | ICD-10-CM | POA: Diagnosis not present

## 2016-09-18 ENCOUNTER — Inpatient Hospital Stay: Payer: PRIVATE HEALTH INSURANCE | Admitting: Internal Medicine

## 2016-10-05 ENCOUNTER — Inpatient Hospital Stay: Payer: PRIVATE HEALTH INSURANCE | Admitting: Internal Medicine

## 2017-06-22 ENCOUNTER — Other Ambulatory Visit: Payer: Self-pay | Admitting: Obstetrics and Gynecology

## 2017-06-22 DIAGNOSIS — Z1231 Encounter for screening mammogram for malignant neoplasm of breast: Secondary | ICD-10-CM

## 2017-08-03 ENCOUNTER — Encounter (INDEPENDENT_AMBULATORY_CARE_PROVIDER_SITE_OTHER): Payer: Self-pay

## 2017-08-03 ENCOUNTER — Ambulatory Visit
Admission: RE | Admit: 2017-08-03 | Discharge: 2017-08-03 | Disposition: A | Discharge status: Home / Self Care | Payer: No Typology Code available for payment source | Source: Ambulatory Visit | Attending: Obstetrics and Gynecology | Admitting: Obstetrics and Gynecology

## 2017-08-03 DIAGNOSIS — Z1231 Encounter for screening mammogram for malignant neoplasm of breast: Secondary | ICD-10-CM

## 2017-12-12 DIAGNOSIS — J209 Acute bronchitis, unspecified: Secondary | ICD-10-CM | POA: Diagnosis not present

## 2017-12-12 DIAGNOSIS — R0682 Tachypnea, not elsewhere classified: Secondary | ICD-10-CM | POA: Diagnosis not present

## 2017-12-12 DIAGNOSIS — J45909 Unspecified asthma, uncomplicated: Secondary | ICD-10-CM | POA: Diagnosis not present

## 2017-12-12 DIAGNOSIS — R0602 Shortness of breath: Secondary | ICD-10-CM | POA: Diagnosis not present

## 2017-12-13 DIAGNOSIS — Z79899 Other long term (current) drug therapy: Secondary | ICD-10-CM | POA: Diagnosis not present

## 2017-12-13 DIAGNOSIS — R0602 Shortness of breath: Secondary | ICD-10-CM | POA: Diagnosis not present

## 2017-12-13 DIAGNOSIS — Z86711 Personal history of pulmonary embolism: Secondary | ICD-10-CM | POA: Diagnosis not present

## 2017-12-13 DIAGNOSIS — J45909 Unspecified asthma, uncomplicated: Secondary | ICD-10-CM | POA: Diagnosis not present

## 2017-12-13 DIAGNOSIS — J45901 Unspecified asthma with (acute) exacerbation: Secondary | ICD-10-CM | POA: Diagnosis not present

## 2017-12-13 DIAGNOSIS — R600 Localized edema: Secondary | ICD-10-CM | POA: Diagnosis not present

## 2017-12-13 DIAGNOSIS — J209 Acute bronchitis, unspecified: Secondary | ICD-10-CM | POA: Diagnosis not present

## 2017-12-14 DIAGNOSIS — R0602 Shortness of breath: Secondary | ICD-10-CM

## 2017-12-23 DIAGNOSIS — J4521 Mild intermittent asthma with (acute) exacerbation: Secondary | ICD-10-CM | POA: Diagnosis not present

## 2018-02-02 DIAGNOSIS — R05 Cough: Secondary | ICD-10-CM | POA: Diagnosis not present

## 2018-02-02 DIAGNOSIS — J018 Other acute sinusitis: Secondary | ICD-10-CM | POA: Diagnosis not present

## 2018-02-02 DIAGNOSIS — J4520 Mild intermittent asthma, uncomplicated: Secondary | ICD-10-CM | POA: Diagnosis not present

## 2018-03-02 DIAGNOSIS — R1013 Epigastric pain: Secondary | ICD-10-CM | POA: Diagnosis not present

## 2018-03-16 DIAGNOSIS — R05 Cough: Secondary | ICD-10-CM | POA: Diagnosis not present

## 2018-03-16 DIAGNOSIS — J189 Pneumonia, unspecified organism: Secondary | ICD-10-CM | POA: Diagnosis not present

## 2018-03-16 DIAGNOSIS — J4521 Mild intermittent asthma with (acute) exacerbation: Secondary | ICD-10-CM | POA: Diagnosis not present

## 2018-03-29 DIAGNOSIS — K222 Esophageal obstruction: Secondary | ICD-10-CM | POA: Diagnosis not present

## 2018-03-29 DIAGNOSIS — K208 Other esophagitis: Secondary | ICD-10-CM | POA: Diagnosis not present

## 2018-03-29 DIAGNOSIS — R1032 Left lower quadrant pain: Secondary | ICD-10-CM | POA: Diagnosis not present

## 2018-03-29 DIAGNOSIS — K210 Gastro-esophageal reflux disease with esophagitis: Secondary | ICD-10-CM | POA: Diagnosis not present

## 2018-03-29 DIAGNOSIS — K228 Other specified diseases of esophagus: Secondary | ICD-10-CM | POA: Diagnosis not present

## 2018-03-29 DIAGNOSIS — K219 Gastro-esophageal reflux disease without esophagitis: Secondary | ICD-10-CM | POA: Diagnosis not present

## 2018-03-29 DIAGNOSIS — K259 Gastric ulcer, unspecified as acute or chronic, without hemorrhage or perforation: Secondary | ICD-10-CM | POA: Diagnosis not present

## 2018-04-11 DIAGNOSIS — J309 Allergic rhinitis, unspecified: Secondary | ICD-10-CM | POA: Diagnosis not present

## 2018-04-11 DIAGNOSIS — J4521 Mild intermittent asthma with (acute) exacerbation: Secondary | ICD-10-CM | POA: Diagnosis not present

## 2018-04-11 DIAGNOSIS — R05 Cough: Secondary | ICD-10-CM | POA: Diagnosis not present

## 2018-04-18 DIAGNOSIS — J3089 Other allergic rhinitis: Secondary | ICD-10-CM | POA: Diagnosis not present

## 2018-04-27 DIAGNOSIS — J189 Pneumonia, unspecified organism: Secondary | ICD-10-CM | POA: Diagnosis not present

## 2018-04-27 DIAGNOSIS — J4521 Mild intermittent asthma with (acute) exacerbation: Secondary | ICD-10-CM | POA: Diagnosis not present

## 2018-04-27 DIAGNOSIS — R05 Cough: Secondary | ICD-10-CM | POA: Diagnosis not present

## 2018-04-28 DIAGNOSIS — R1012 Left upper quadrant pain: Secondary | ICD-10-CM | POA: Diagnosis not present

## 2018-04-29 DIAGNOSIS — Z01419 Encounter for gynecological examination (general) (routine) without abnormal findings: Secondary | ICD-10-CM | POA: Diagnosis not present

## 2018-05-06 DIAGNOSIS — J4521 Mild intermittent asthma with (acute) exacerbation: Secondary | ICD-10-CM | POA: Diagnosis not present

## 2018-05-11 DIAGNOSIS — J4520 Mild intermittent asthma, uncomplicated: Secondary | ICD-10-CM | POA: Diagnosis not present

## 2018-05-11 DIAGNOSIS — R768 Other specified abnormal immunological findings in serum: Secondary | ICD-10-CM | POA: Diagnosis not present

## 2018-05-11 DIAGNOSIS — R05 Cough: Secondary | ICD-10-CM | POA: Diagnosis not present

## 2018-05-17 DIAGNOSIS — Z30433 Encounter for removal and reinsertion of intrauterine contraceptive device: Secondary | ICD-10-CM | POA: Diagnosis not present

## 2018-06-01 DIAGNOSIS — Z30431 Encounter for routine checking of intrauterine contraceptive device: Secondary | ICD-10-CM | POA: Diagnosis not present

## 2018-06-07 ENCOUNTER — Other Ambulatory Visit: Payer: Self-pay | Admitting: Obstetrics and Gynecology

## 2018-06-07 DIAGNOSIS — Z1231 Encounter for screening mammogram for malignant neoplasm of breast: Secondary | ICD-10-CM

## 2018-08-04 ENCOUNTER — Ambulatory Visit
Admission: RE | Admit: 2018-08-04 | Discharge: 2018-08-04 | Disposition: A | Discharge status: Home / Self Care | Payer: 59 | Source: Ambulatory Visit | Attending: Obstetrics and Gynecology | Admitting: Obstetrics and Gynecology

## 2018-08-04 DIAGNOSIS — Z1231 Encounter for screening mammogram for malignant neoplasm of breast: Secondary | ICD-10-CM

## 2018-08-27 DIAGNOSIS — J209 Acute bronchitis, unspecified: Secondary | ICD-10-CM | POA: Diagnosis not present

## 2018-11-08 DIAGNOSIS — K1370 Unspecified lesions of oral mucosa: Secondary | ICD-10-CM | POA: Diagnosis not present

## 2019-03-03 DIAGNOSIS — J45901 Unspecified asthma with (acute) exacerbation: Secondary | ICD-10-CM | POA: Diagnosis not present

## 2019-03-06 DIAGNOSIS — J4521 Mild intermittent asthma with (acute) exacerbation: Secondary | ICD-10-CM | POA: Diagnosis not present

## 2019-03-06 DIAGNOSIS — R0600 Dyspnea, unspecified: Secondary | ICD-10-CM | POA: Diagnosis not present

## 2019-03-06 DIAGNOSIS — R0602 Shortness of breath: Secondary | ICD-10-CM | POA: Diagnosis not present

## 2019-03-10 DIAGNOSIS — Z6836 Body mass index (BMI) 36.0-36.9, adult: Secondary | ICD-10-CM | POA: Diagnosis not present

## 2019-03-10 DIAGNOSIS — J4540 Moderate persistent asthma, uncomplicated: Secondary | ICD-10-CM | POA: Diagnosis not present

## 2019-03-10 DIAGNOSIS — R0789 Other chest pain: Secondary | ICD-10-CM | POA: Diagnosis not present

## 2019-06-29 ENCOUNTER — Other Ambulatory Visit: Payer: Self-pay | Admitting: Obstetrics and Gynecology

## 2019-06-29 DIAGNOSIS — Z1231 Encounter for screening mammogram for malignant neoplasm of breast: Secondary | ICD-10-CM

## 2019-08-11 ENCOUNTER — Ambulatory Visit: Payer: 59

## 2019-10-02 ENCOUNTER — Ambulatory Visit
Admission: RE | Admit: 2019-10-02 | Discharge: 2019-10-02 | Disposition: A | Discharge status: Home / Self Care | Payer: 59 | Source: Ambulatory Visit | Attending: Obstetrics and Gynecology | Admitting: Obstetrics and Gynecology

## 2019-10-02 ENCOUNTER — Other Ambulatory Visit: Payer: Self-pay

## 2019-10-02 DIAGNOSIS — Z1231 Encounter for screening mammogram for malignant neoplasm of breast: Secondary | ICD-10-CM

## 2020-08-06 ENCOUNTER — Other Ambulatory Visit: Payer: Self-pay | Admitting: Obstetrics and Gynecology

## 2020-08-06 DIAGNOSIS — Z1231 Encounter for screening mammogram for malignant neoplasm of breast: Secondary | ICD-10-CM

## 2020-10-02 ENCOUNTER — Other Ambulatory Visit: Payer: Self-pay

## 2020-10-02 ENCOUNTER — Ambulatory Visit
Admission: RE | Admit: 2020-10-02 | Discharge: 2020-10-02 | Disposition: A | Discharge status: Home / Self Care | Payer: 59 | Source: Ambulatory Visit | Attending: Obstetrics and Gynecology | Admitting: Obstetrics and Gynecology

## 2020-10-02 DIAGNOSIS — Z1231 Encounter for screening mammogram for malignant neoplasm of breast: Secondary | ICD-10-CM

## 2021-07-02 ENCOUNTER — Other Ambulatory Visit: Payer: Self-pay | Admitting: Obstetrics and Gynecology

## 2021-07-02 DIAGNOSIS — Z1231 Encounter for screening mammogram for malignant neoplasm of breast: Secondary | ICD-10-CM

## 2021-10-06 ENCOUNTER — Ambulatory Visit
Admission: RE | Admit: 2021-10-06 | Discharge: 2021-10-06 | Disposition: A | Discharge status: Home / Self Care | Payer: 59 | Source: Ambulatory Visit | Attending: Obstetrics and Gynecology | Admitting: Obstetrics and Gynecology

## 2021-10-06 DIAGNOSIS — Z1231 Encounter for screening mammogram for malignant neoplasm of breast: Secondary | ICD-10-CM

## 2022-08-17 ENCOUNTER — Other Ambulatory Visit: Payer: Self-pay | Admitting: Obstetrics and Gynecology

## 2022-08-17 DIAGNOSIS — Z1231 Encounter for screening mammogram for malignant neoplasm of breast: Secondary | ICD-10-CM

## 2022-10-15 ENCOUNTER — Ambulatory Visit: Payer: 59

## 2023-01-11 ENCOUNTER — Encounter: Payer: Self-pay | Admitting: Internal Medicine

## 2023-01-11 ENCOUNTER — Ambulatory Visit: Payer: 59 | Admitting: Internal Medicine

## 2023-01-11 VITALS — BP 138/70 | HR 73 | Temp 97.3°F | Resp 18 | Ht 62.2 in | Wt 159.4 lb

## 2023-01-11 DIAGNOSIS — Z91013 Allergy to seafood: Secondary | ICD-10-CM | POA: Diagnosis not present

## 2023-01-11 DIAGNOSIS — M3390 Dermatopolymyositis, unspecified, organ involvement unspecified: Secondary | ICD-10-CM | POA: Diagnosis not present

## 2023-01-11 DIAGNOSIS — Z91012 Allergy to eggs: Secondary | ICD-10-CM

## 2023-01-11 DIAGNOSIS — L308 Other specified dermatitis: Secondary | ICD-10-CM

## 2023-01-11 MED ORDER — OMEPRAZOLE 20 MG PO CPDR: 20.0000 mg | DELAYED_RELEASE_CAPSULE | Freq: Every day | ORAL | 5 refills | Status: DC

## 2023-01-11 MED ORDER — PREDNISONE 20 MG PO TABS: ORAL_TABLET | ORAL | 0 refills | Status: DC

## 2023-01-11 MED ORDER — METHYLPREDNISOLONE ACETATE 80 MG/ML IJ SUSP
80.0000 mg | Freq: Once | INTRAMUSCULAR | Status: AC
  Administered 2023-01-11: 80 mg via INTRAMUSCULAR

## 2023-01-11 NOTE — Patient Instructions (Addendum)
Dermatitis  - We will get labs to evaluate for allergic and autoimmune causes of dermatitis  - Depo 80mg  Im given today  - Start Prednisone: 80mg  daily for 4 days, 60mg  daily for 4 days, 40mg  daily for 4 days, 20mg  daily for 4 days, then stop  - Start omeprazole 20mg  daily (this will protect your stomach while on prednisone)  - Gentle skin care as below   Follow up in 2 weeks  Thank you so much for letting me partake in your care today.  Don't hesitate to reach out if you have any additional concerns!  Roney Marion, MD  [facility] [place], [place]  Skin Care: Daily Care For Maintenance (daily and continue even once eczema controlled) - Recommend hypoallergenic hydrating ointment at least twice daily.  This must be done daily for control of flares. (Great options include Vaseline, CeraVe, Aquaphor, Aveeno, Cetaphil, VaniCream, etc) - Recommend avoiding detergents, soaps or lotions with fragrances/dyes, and instead using products which are hypoallergenic, use second rinse cycle when washing clothes -Wear lose breathable clothing, avoid wool -Avoid extremes of humidity - Limit showers/baths to 5 minutes and use luke warm water instead of hot, pat dry following baths, and apply moisturizer   For Flares:(add this to maintenance therapy if needed for flares) - Clobetasol 0.5% to body for severe flares-apply topically twice daily to red, raised, thickened areas of skin, followed by moisturizer

## 2023-01-11 NOTE — Progress Notes (Signed)
New Patient Note  RE: Melinda Guerrero MRN: [id number] DOB: 05/30/1971 Date of Office Visit: 01/11/2023  Consult requested by: Ronita Hipps, MD Primary care provider: Damaris Schooner, DO  Chief Complaint: Rash (Hands, chest, arms, painful)  History of Present Illness: I had the pleasure of seeing Melinda Guerrero for initial evaluation at the [facility] on 01/15/2023. She is a 52 y.o. female, who is referred here by Damaris Schooner, DO for the evaluation of atopic dermatitis .  History obtained from patient, chart review.  Dermatitis  Patient reports 4 month history of recurrent dermatitis on hands and neck.  She reported having pruritic urticaria a few hours after her flu vaccine in November.  She has a history of an egg allergy and this is the first time she has received the flu vaccine.  She is unsure if this relates to her current symptoms as they did not start until a few weeks later.  Symptoms are pruritic, burning, erythematous, dry and flaky.  In sun exposed areas but she denies any significant sun exposure.  Send numerous courses of prednisone, antibiotics.  Does not tolerate topicals due to burning sensation when she applies them.  She does report some history of blistering on her neck and she did have a hospitalization at [facility] in late January February.  She received IV Solu-Medrol and IV antibiotics as well as Mobic.  She is unsure what workup was done at that time her ultimate diagnosis.  Symptoms are somewhat steroid responsive.  Although she is unsure what doses of steroids she has received.  Reports most recent 2 weeks was at 4 mg a day.  At the same time she has had weight gain, fatigue, muscle weakness.  She has 4 steps to get into her house and has a harder time getting up them.  Symptoms are extremely disruptive to quality of life and she is unable to sleep.  She has had no lab workup, skin biopsy.   Assessment and Plan: Melinda Guerrero is a  52 y.o. female with: Other specified dermatitis - Plan: CBC With Diff/Platelet, CMP14+EGFR, Lipid Panel w/o Chol/HDL Ratio, QuantiFERON-TB Gold Plus, Allergens w/Total IgE Area 2, Chronic Urticaria, Alpha-Gal Panel, Sed Rate (ESR), Tryptase, C-reactive protein  Dermatomyositis (HCC) - Plan: Sed Rate (ESR), C-reactive protein, CK, MyoMarker 3 Plus Profile (RDL), ANA Comprehensive Panel, Spirometry with Graph  History of allergy to eggs  History of allergy to shellfish  Patient presents with 3 to 65-month history of recurrent eczematous rash on neck and hands, associated with muscle weakness, weight gain.  Previous treatments include systemic and topical steroids and antibiotics.  I have no concern for infectious etiology.  Differential diagnosis includes allergic contact dermatitis, adult onset atopic dermatitis.  However given distribution and associated muscle weakness and most concern for dermatomyositis or other autoimmune skin diseases.  Will get initial lab workup to evaluate for the above.  Patient can stop antibiotics.  Start a more prolonged higher dose prednisone taper for symptomatic relief.  Ideally would like to get a skin biopsy however, her prolonged prednisone involvement of the past few months have made this more difficult.  May consider in the future. Plan: Patient Instructions  Dermatitis  - We will get labs to evaluate for allergic and autoimmune causes of dermatitis  - Depo 80mg  Im given today  - Start Prednisone: 80mg  daily for 4 days, 60mg  daily for 4 days, 40mg  daily for 4 days, 20mg  daily for 4 days,  then stop  - Start omeprazole 20mg  daily (this will protect your stomach while on prednisone)  - Gentle skin care as below   Follow up in 2 weeks  Thank you so much for letting me partake in your care today.  Don't hesitate to reach out if you have any additional concerns!  Melinda Marion, MD  [facility] [place] [place]  Skin Care: Daily Care For  Maintenance (daily and continue even once eczema controlled) - Recommend hypoallergenic hydrating ointment at least twice daily.  This must be done daily for control of flares. (Great options include Vaseline, CeraVe, Aquaphor, Aveeno, Cetaphil, VaniCream, etc) - Recommend avoiding detergents, soaps or lotions with fragrances/dyes, and instead using products which are hypoallergenic, use second rinse cycle when washing clothes -Wear lose breathable clothing, avoid wool -Avoid extremes of humidity - Limit showers/baths to 5 minutes and use luke warm water instead of hot, pat dry following baths, and apply moisturizer   For Flares:(add this to maintenance therapy if needed for flares) - Clobetasol 0.5% to body for severe flares-apply topically twice daily to red, raised, thickened areas of skin, followed by moisturizer     Meds ordered this encounter  Medications   omeprazole (PRILOSEC) 20 MG capsule    Sig: Take 1 capsule (20 mg total) by mouth daily.    Dispense:  30 capsule    Refill:  5   predniSONE (DELTASONE) 20 MG tablet    Sig: Start Prednisone: 80mg  daily for 4 days, 60mg  daily for 4 days, 40mg  daily for 4 days, 20mg  daily for 4 days, then stop    Dispense:  40 tablet    Refill:  0   methylPREDNISolone acetate (DEPO-MEDROL) injection 80 mg   Lab Orders         CBC With Diff/Platelet         CMP14+EGFR         Lipid Panel w/o Chol/HDL Ratio         QuantiFERON-TB Gold Plus         Allergens w/Total IgE Area 2         Chronic Urticaria         Alpha-Gal Panel         Sed Rate (ESR)         Tryptase         C-reactive protein         CK         MyoMarker 3 Plus Profile (RDL)         ANA Comprehensive Panel      Other allergy screening: Asthma: no Rhino conjunctivitis: no Food allergy:  Egg Allergy: urticaria and dyspnea, shellfish - urticaria and wheezing   Medication allergy:  Iodine- hives  Hymenoptera allergy: no Urticaria: no Eczema:no History of recurrent  infections suggestive of immunodeficency: no  Diagnostics: Spirometry:  Tracings reviewed. Her effort: It was hard to get consistent efforts and there is a question as to whether this reflects a maximal maneuver. FVC: 2.23L FEV1: 1.91L, 75% predicted FEV1/FVC ratio: 86% Interpretation: Spirometry consistent with possible restrictive disease.after 4 puffs of albuterol there was no significant post bronchodilator response  Please see scanned spirometry results for details.   Results interpreted by myself and discussed with patient/family.   Past Medical History: Patient Active Problem List   Diagnosis Date Noted   Osteoarthritis resulting from right hip dysplasia 05/07/2015   Hip arthritis 05/07/2015   Celiac disease    PONV (postoperative  nausea and vomiting)    Asthma    Past Medical History:  Diagnosis Date   Angio-edema    Arthritis    left hand after carpal tunnel release   Asthma    Celiac disease    Eczema    Osteoarthritis resulting from right hip dysplasia 05/07/2015   Pneumonia    PONV (postoperative nausea and vomiting)    Urticaria    Past Surgical History: Past Surgical History:  Procedure Laterality Date   BREAST SURGERY     breast biopsy (3 clips on right breast)   CARPAL TUNNEL RELEASE Left    CARPAL TUNNEL RELEASE Right    left leg surgery     tendons and nerves removed to use to rebuild hand   TOTAL HIP ARTHROPLASTY Right 05/07/2015   Procedure: TOTAL HIP ARTHROPLASTY;  Surgeon: Marchia Bond, MD;  Location: [facility];  Service: Orthopedics;  Laterality: Right;   Medication List:  Current Outpatient Medications  Medication Sig Dispense Refill   albuterol (PROVENTIL) (2.5 MG/3ML) 0.083% nebulizer solution Inhale into the lungs.     albuterol (VENTOLIN HFA) 108 (90 Base) MCG/ACT inhaler Inhale 1-2 puffs into the lungs every 4 (four) hours as needed for wheezing or shortness of breath.     cetirizine (ZYRTEC) 10 MG tablet Take 10 mg by mouth daily.      doxycycline (VIBRA-TABS) 100 MG tablet Take 100 mg by mouth 2 (two) times daily.     HYDROcodone-acetaminophen (NORCO/VICODIN) 5-325 MG tablet Take 1-2 tablets by mouth every 4 (four) hours as needed for moderate pain. 20 tablet 0   hydrOXYzine (ATARAX) 25 MG tablet Take 25 mg by mouth 3 (three) times daily.     ipratropium (ATROVENT) 0.02 % nebulizer solution Take 0.5 mg by nebulization every 6 (six) hours as needed for wheezing or shortness of breath.     levocetirizine (XYZAL) 5 MG tablet Take 5 mg by mouth once a week.  3   LORazepam (ATIVAN) 0.5 MG tablet Take 0.5 mg by mouth 2 (two) times daily as needed for anxiety.   0   montelukast (SINGULAIR) 10 MG tablet Take 10 mg by mouth daily.  2   omeprazole (PRILOSEC) 20 MG capsule Take 1 capsule (20 mg total) by mouth daily. 30 capsule 5   predniSONE (DELTASONE) 20 MG tablet Start Prednisone: 80mg  daily for 4 days, 60mg  daily for 4 days, 40mg  daily for 4 days, 20mg  daily for 4 days, then stop 40 tablet 0   SYMBICORT 160-4.5 MCG/ACT inhaler Inhale 1 puff into the lungs daily as needed (shortness of breath).   0   XARELTO 20 MG TABS tablet Take 20 mg by mouth daily.  3   No current facility-administered medications for this visit.   Allergies: Allergies  Allergen Reactions   Egg-Derived Products Anaphylaxis and Hives   Iodine Hives    With contrast dye - had hives, throat felt scratchy and some trouble breathing   Other Anaphylaxis and Hives    All seafood   Shellfish Allergy Anaphylaxis and Hives   Iohexol Hives   Social History: Social History   Socioeconomic History   Marital status: Married    Spouse name: Not on file   Number of children: Not on file   Years of education: Not on file   Highest education level: Not on file  Occupational History   Not on file  Tobacco Use   Smoking status: Never   Smokeless tobacco: Never  Vaping Use  Vaping Use: Never used  Substance and Sexual Activity   Alcohol use: No   Drug use: No    Sexual activity: Not on file  Other Topics Concern   Not on file  Social History Narrative   Not on file   Social Determinants of Health   Financial Resource Strain: Not on file  Food Insecurity: Not on file  Transportation Needs: Not on file  Physical Activity: Not on file  Stress: Not on file  Social Connections: Not on file   Lives in a single-family home that is built in 1997, no roaches in the house and bed is 2 feet off the floor.  No dust mite precautions.  Not exposed to fumes, chemicals or dust.  HEPA filter in the home and home [place] not near an [place] area. Smoking: No exposure Occupation: Geophysical data processor History: Water Damage/mildew in the house: no Carpet in the family room: no Carpet in the bedroom: no Heating: heat pump Cooling: heat pump Pet: yes dog without access to bedroom  Family History: Family History  Problem Relation Age of Onset   Asthma Mother    Eczema Mother    Urticaria Mother    Hypertension Mother    Glaucoma Mother    Heart disease Mother    Lung cancer Father    Breast cancer Neg Hx    Allergic rhinitis Neg Hx      ROS: All others negative except as noted per HPI.   Objective: BP 138/70 (BP Location: Right Arm, Patient Position: Sitting, Cuff Size: Normal)   Pulse 73   Temp (!) 97.3 F (36.3 C) (Temporal)   Resp 18   Ht 5' 2.2" (1.58 m)   Wt 159 lb 6.4 oz (72.3 kg)   SpO2 100%   BMI 28.97 kg/m  Body mass index is 28.97 kg/m.  General Appearance:  Alert, cooperative, no distress, appears stated age  Head:  Normocephalic, without obvious abnormality, atraumatic  Eyes:  Conjunctiva clear, EOM's intact  Nose: Nares normal, normal mucosa, no visible anterior polyps, and septum midline  Throat: Lips, tongue normal; teeth and gums normal, normal posterior oropharynx  Neck: Supple, symmetrical  Lungs:   clear to auscultation bilaterally, Respirations unlabored, no coughing  Heart:  regular rate  and rhythm and no murmur, Appears well perfused  Extremities: No edema  Skin: Skin color, texture, turgor normal,  eczematous dermatitis covering hands up to mid arm, neck sparing face.  Neurologic: No gross deficits, 4 out 5 strength in all extremities.   The plan was reviewed with the patient/family, and all questions/concerned were addressed.  It was my pleasure to see Melinda Guerrero today and participate in her care. Please feel free to contact me with any questions or concerns.  Sincerely,  Melinda Marion, MD Allergy & Immunology  [facility] office: [phone] [facility] office: [phone]

## 2023-01-12 LAB — MYOMARKER 3 PLUS PROFILE (RDL)

## 2023-01-12 LAB — CMP14+EGFR
Alkaline Phosphatase: 81 IU/L (ref 44–121)
Glucose: 88 mg/dL (ref 70–99)

## 2023-01-12 LAB — ALPHA-GAL PANEL

## 2023-01-12 LAB — ANA COMPREHENSIVE PANEL: Chromatin Ab SerPl-aCnc: <0.2 AI (ref 0.0–0.9)

## 2023-01-13 LAB — MYOMARKER 3 PLUS PROFILE (RDL)

## 2023-01-13 LAB — ALLERGENS W/TOTAL IGE AREA 2

## 2023-01-13 LAB — CBC WITH DIFF/PLATELET
EOS (ABSOLUTE): 0.4 10*3/uL (ref 0.0–0.4)
Hematocrit: 38.5 % (ref 34.0–46.6)
Immature Grans (Abs): 0 10*3/uL (ref 0.0–0.1)
Neutrophils: 60 %

## 2023-01-13 LAB — ANA COMPREHENSIVE PANEL

## 2023-01-13 LAB — ALPHA-GAL PANEL

## 2023-01-13 LAB — LIPID PANEL W/O CHOL/HDL RATIO: Cholesterol, Total: 270 mg/dL — ABNORMAL HIGH (ref 100–199)

## 2023-01-15 LAB — CBC WITH DIFF/PLATELET
Basos: 0 %
Immature Granulocytes: 0 %
Lymphocytes Absolute: 1.7 10*3/uL (ref 0.7–3.1)
WBC: 6.9 10*3/uL (ref 3.4–10.8)

## 2023-01-15 LAB — CMP14+EGFR
BUN: 19 mg/dL (ref 6–24)
Chloride: 107 mmol/L — ABNORMAL HIGH (ref 96–106)

## 2023-01-15 LAB — ALLERGENS W/TOTAL IGE AREA 2
Cladosporium Herbarum IgE: 20 kU/L — AB
Mouse Urine IgE: 23 kU/L — AB
Timothy Grass IgE: 6.28 kU/L — AB

## 2023-01-15 LAB — MYOMARKER 3 PLUS PROFILE (RDL)
Anti-NXP-2 (P140) Ab (RDL): <20 Units (ref <20)
Anti-TIF-1gamma Ab (RDL): <20 Units (ref <20)

## 2023-01-16 LAB — CBC WITH DIFF/PLATELET
Basophils Absolute: 0 10*3/uL (ref 0.0–0.2)
MCHC: 31.7 g/dL (ref 31.5–35.7)

## 2023-01-16 LAB — ALLERGENS W/TOTAL IGE AREA 2
Aspergillus Fumigatus IgE: 0.81 kU/L — AB
Bermuda Grass IgE: 5.85 kU/L — AB
Cockroach, German IgE: 0.79 kU/L — AB
Penicillium Chrysogen IgE: 0.1 kU/L — AB
Ragweed, Short IgE: 2.49 kU/L — AB
Sheep Sorrel IgE Qn: 2.09 kU/L — AB

## 2023-01-16 LAB — MYOMARKER 3 PLUS PROFILE (RDL)

## 2023-01-16 LAB — LIPID PANEL W/O CHOL/HDL RATIO: LDL Chol Calc (NIH): 183 mg/dL — ABNORMAL HIGH (ref 0–99)

## 2023-01-16 LAB — ANA COMPREHENSIVE PANEL: Centromere Ab Screen: <0.2 AI (ref 0.0–0.9)

## 2023-01-16 LAB — QUANTIFERON-TB GOLD PLUS

## 2023-01-16 LAB — CMP14+EGFR
Albumin: 4.4 g/dL (ref 3.8–4.9)
Creatinine, Ser: 0.48 mg/dL — ABNORMAL LOW (ref 0.57–1.00)

## 2023-01-19 LAB — QUANTIFERON-TB GOLD PLUS

## 2023-01-19 LAB — LIPID PANEL W/O CHOL/HDL RATIO: HDL: 75 mg/dL (ref >39)

## 2023-01-19 LAB — CMP14+EGFR
ALT: 31 IU/L (ref 0–32)
Albumin/Globulin Ratio: 1.8 (ref 1.2–2.2)
CO2: 22 mmol/L (ref 20–29)

## 2023-01-19 LAB — MYOMARKER 3 PLUS PROFILE (RDL): Anti-SS-A 52kD Ab, IgG (RDL): <20 Units (ref <20)

## 2023-01-19 LAB — SEDIMENTATION RATE: Sed Rate: 9 mm/hr (ref 0–40)

## 2023-01-19 LAB — CBC WITH DIFF/PLATELET
Lymphs: 24 %
Monocytes: 10 %
Neutrophils Absolute: 4.1 10*3/uL (ref 1.4–7.0)

## 2023-01-19 LAB — ALPHA-GAL PANEL: O215-IgE Alpha-Gal: 0.13 kU/L — AB

## 2023-01-19 LAB — ANA COMPREHENSIVE PANEL
ENA RNP Ab: <0.2 AI (ref 0.0–0.9)
ENA SSB (LA) Ab: <0.2 AI (ref 0.0–0.9)
dsDNA Ab: <1 IU/mL (ref 0–9)

## 2023-01-20 LAB — QUANTIFERON-TB GOLD PLUS
QuantiFERON Mitogen Value: 4.93 IU/mL
QuantiFERON TB2 Ag Value: 0.12 IU/mL

## 2023-01-20 LAB — ALPHA-GAL PANEL: Allergen Lamb IgE: 2.41 kU/L — AB

## 2023-01-20 LAB — ANA COMPREHENSIVE PANEL: ENA SM Ab Ser-aCnc: <0.2 AI (ref 0.0–0.9)

## 2023-01-20 LAB — ALLERGENS W/TOTAL IGE AREA 2
Cat Dander IgE: >100 kU/L — AB
Common Silver Birch IgE: 3.05 kU/L — AB
Cottonwood IgE: 3.51 kU/L — AB
Dog Dander IgE: >100 kU/L — AB
Elm, American IgE: 4.56 kU/L — AB
Johnson Grass IgE: 3.1 kU/L — AB
Oak, White IgE: 4.32 kU/L — AB
Pecan, Hickory IgE: 3.87 kU/L — AB
White Mulberry IgE: 0.31 kU/L — AB

## 2023-01-20 LAB — CBC WITH DIFF/PLATELET
Eos: 6 %
Monocytes Absolute: 0.7 10*3/uL (ref 0.1–0.9)
RBC: 4.6 x10E6/uL (ref 3.77–5.28)

## 2023-01-20 LAB — CMP14+EGFR
AST: 25 IU/L (ref 0–40)
BUN/Creatinine Ratio: 40 — ABNORMAL HIGH (ref 9–23)
Bilirubin Total: 0.2 mg/dL (ref 0.0–1.2)
Calcium: 9.4 mg/dL (ref 8.7–10.2)
Globulin, Total: 2.4 g/dL (ref 1.5–4.5)
Potassium: 4.2 mmol/L (ref 3.5–5.2)
Total Protein: 6.8 g/dL (ref 6.0–8.5)
eGFR: 114 mL/min/{1.73_m2} (ref >59)

## 2023-01-20 LAB — MYOMARKER 3 PLUS PROFILE (RDL): Anti-MDA-5 Ab (CADM-140)(RDL): <20 Units (ref <20)

## 2023-01-20 LAB — LIPID PANEL W/O CHOL/HDL RATIO: Triglycerides: 75 mg/dL (ref 0–149)

## 2023-01-20 LAB — C-REACTIVE PROTEIN: CRP: 2 mg/L (ref 0–10)

## 2023-01-20 LAB — TRYPTASE: Tryptase: 9 ug/L (ref 2.2–13.2)

## 2023-01-26 LAB — ANA COMPREHENSIVE PANEL
Anti JO-1: <0.2 AI (ref 0.0–0.9)
ENA SSA (RO) Ab: <0.2 AI (ref 0.0–0.9)
Scleroderma (Scl-70) (ENA) Antibody, IgG: <0.2 AI (ref 0.0–0.9)

## 2023-01-26 LAB — CBC WITH DIFF/PLATELET
Hemoglobin: 12.2 g/dL (ref 11.1–15.9)
MCH: 26.5 pg — ABNORMAL LOW (ref 26.6–33.0)
MCV: 84 fL (ref 79–97)
Platelets: 363 10*3/uL (ref 150–450)
RDW: 14.5 % (ref 11.7–15.4)

## 2023-01-26 LAB — QUANTIFERON-TB GOLD PLUS
QuantiFERON Nil Value: 0.09 IU/mL
QuantiFERON-TB Gold Plus: NEGATIVE

## 2023-01-26 LAB — ALLERGENS W/TOTAL IGE AREA 2
Alternaria Alternata IgE: 16.3 kU/L — AB
Cedar, Mountain IgE: 2.22 kU/L — AB
Maple/Box Elder IgE: 1.83 kU/L — AB

## 2023-01-26 LAB — MYOMARKER 3 PLUS PROFILE (RDL)
Anti-EJ Ab (RDL): NEGATIVE
Anti-Jo-1 Ab (RDL): <20 Units (ref <20)
Anti-PM/Scl-100 Ab (RDL): <20 Units (ref <20)
Anti-U2 RNP Ab (RDL): NEGATIVE

## 2023-01-26 LAB — CHRONIC URTICARIA: cu index: <1.2 (ref <10)

## 2023-01-26 LAB — CMP14+EGFR: Sodium: 144 mmol/L (ref 134–144)

## 2023-01-26 LAB — LIPID PANEL W/O CHOL/HDL RATIO: VLDL Cholesterol Cal: 12 mg/dL (ref 5–40)

## 2023-01-26 NOTE — Progress Notes (Signed)
Lab workup for autoimmune causes for muscle weakness and rash were negative.  Blood counts were normal.  Marker for red meat allergy was fairly negative.  Cholesterol levels were slightly elevated and she should follow with her primary care for this.  Cholesterol levels were obtained to evaluate for certain treatment options for rash.  Blood work was very positive to cat, dog, dust mite, roach, mouse, mold, grass, tree, weed.  Overall based on the testing I recommend treating like atopic dermatitis/eczema.  I would recommend starting Dupixent injections and going from there.  Can someone contact patient and let her know and gauge interest for Dupixent.

## 2023-01-27 ENCOUNTER — Encounter: Payer: Self-pay | Admitting: Internal Medicine

## 2023-01-27 ENCOUNTER — Ambulatory Visit (INDEPENDENT_AMBULATORY_CARE_PROVIDER_SITE_OTHER): Payer: 59 | Admitting: Internal Medicine

## 2023-01-27 VITALS — BP 120/60 | HR 113 | Temp 97.6°F | Resp 17

## 2023-01-27 DIAGNOSIS — L2089 Other atopic dermatitis: Secondary | ICD-10-CM

## 2023-01-27 DIAGNOSIS — L308 Other specified dermatitis: Secondary | ICD-10-CM

## 2023-01-27 DIAGNOSIS — Z91012 Allergy to eggs: Secondary | ICD-10-CM | POA: Diagnosis not present

## 2023-01-27 DIAGNOSIS — Z91013 Allergy to seafood: Secondary | ICD-10-CM

## 2023-01-27 MED ORDER — DUPILUMAB 300 MG/2ML ~~LOC~~ SOSY
300.0000 mg | PREFILLED_SYRINGE | Freq: Once | SUBCUTANEOUS | Status: DC
  Administered 2023-01-27: 300 mg via SUBCUTANEOUS

## 2023-01-27 MED ORDER — DUPILUMAB 300 MG/2ML ~~LOC~~ SOSY: 600.0000 mg | PREFILLED_SYRINGE | Freq: Once | SUBCUTANEOUS | Status: AC

## 2023-01-27 NOTE — Progress Notes (Unsigned)
Immunotherapy   Patient Details  Name: Melinda Guerrero MRN: [id number] Date of Birth: [birth year]  01/27/2023  [name] started dupixent 600 mcg loading today For atopic dermatitis. 300 mcg bi-weekly.  Frequency:every 2 weeks Epi-Pen: Yes Consent signed and patient instructions given.   [name] 01/27/2023, 4:17 PM

## 2023-01-27 NOTE — Progress Notes (Unsigned)
Follow Up Note  RE: Melinda Guerrero L Latour MRN: [id number] DOB: 03/05/1971 Date of Office Visit: 01/27/2023  Referring provider: Marylen PontoMannino, Angela, DO Primary care provider: Marylen PontoMannino, Angela, DO  Chief Complaint: Follow-up (Pt states she's only 24 hrs off her prednisone and she's already flaring up.)  History of Present Illness: I had the pleasure of seeing Candis Shineracy Christenberry for a follow up visit at the [facility] [place] on 01/28/2023. She is a 52 y.o. female, who is being followed for dermatitis, food allergy, asthma . Her previous allergy office visit was on 01/11/23 with Dr. Marlynn PerkingLomasney. Today is a regular follow up visit.  History obtained from patient, chart review..  At last visit she was given depo injection and prolonged prednisone course.  Lab work up for dermatomyositis, inflammatory markers, alpha gal, tryptase was negative.  sIgE showed pansensitzation with IgE of 2,844   Today she reports good response to prednisone, but itchy started within 2 hours of last dose.  She is using clobetasol daily.  She is interested in dupixent.    Asthma is well controlled with symbicort 160mcg 2 puffs twice daily.  Denies any albuterol use in past 30 days.  No ABX or OCS for asthma in past 12 months (many for skin symptoms).   She continues to avoid egg and shellfish in all forms. Has epipen and denies any accidental ingestions or reactions since last visit.   Assessment and Plan: French Anaracy is a 52 y.o. female with: Other atopic dermatitis  History of allergy to eggs  History of allergy to shellfish   Plan: Patient Instructions  Atopic Dermatitis: not well controlled, severe BSA 10%  - Labs showed pansensitization to all the environmentals with high IgE (allergy antibody) - Negative labs: inflammatory makers, autoantibodies for autoimmune skin diseases such as dermatomyositis  - Based on labs, this rash is atopic dermatitis which is severe and uncontrolled - We we will start Dupixent,  600 mg loading dose given today via sample, you will continue to receive 300 mg subcutaneously every 2 weeks - Our biologic coordinator Tammy will reach out to you on the approval process - Continue basic skin care and topical steroids as below   - Once skin is improved we will consider allergy testing for updating egg and shellfish allergen as well as any additional environmental testing needed - Continue Symbicort 160 mcg 1 puff daily for asthma   Follow up in 8 weeks  Thank you so much for letting me partake in your care today.  Don't hesitate to reach out if you have any additional concerns!  Ferol LuzEvelyn Kanae Ignatowski, MD  [facility] [place]  Skin Care: Daily Care For Maintenance (daily and continue even once eczema controlled) - Recommend hypoallergenic hydrating ointment at least twice daily.  This must be done daily for control of flares. (Great options include Vaseline, CeraVe, Aquaphor, Aveeno, Cetaphil, VaniCream, etc) - Recommend avoiding detergents, soaps or lotions with fragrances/dyes, and instead using products which are hypoallergenic, use second rinse cycle when washing clothes -Wear lose breathable clothing, avoid wool,  avoid any jackets with tight and rough fabric around wrists or neck  -Avoid extremes of humidity - Limit showers/baths to 5 minutes and use luke warm water instead of hot, pat dry following baths, and apply moisturizer -Start Zyrtec (cetirizine) 10 mg twice daily to help with itch   For Flares:(add this to maintenance therapy if needed for flares) - Clobetasol 0.5% to body for severe flares-apply topically twice  daily to red, raised, thickened areas of skin, followed by moisturizer  -As lesions start to heal, stepdown to triamcinolone 0.1% ointment    Meds ordered this encounter  Medications   DISCONTD: dupilumab (DUPIXENT) prefilled syringe 300 mg   dupilumab (DUPIXENT) prefilled syringe 600 mg    Lab Orders  No laboratory  test(s) ordered today   Diagnostics: None done    Medication List:  Current Outpatient Medications  Medication Sig Dispense Refill   albuterol (PROVENTIL) (2.5 MG/3ML) 0.083% nebulizer solution Inhale into the lungs.     albuterol (VENTOLIN HFA) 108 (90 Base) MCG/ACT inhaler Inhale 1-2 puffs into the lungs every 4 (four) hours as needed for wheezing or shortness of breath.     cetirizine (ZYRTEC) 10 MG tablet Take 10 mg by mouth daily.     doxycycline (VIBRA-TABS) 100 MG tablet Take 100 mg by mouth 2 (two) times daily.     HYDROcodone-acetaminophen (NORCO/VICODIN) 5-325 MG tablet Take 1-2 tablets by mouth every 4 (four) hours as needed for moderate pain. 20 tablet 0   hydrOXYzine (ATARAX) 25 MG tablet Take 25 mg by mouth 3 (three) times daily.     ipratropium (ATROVENT) 0.02 % nebulizer solution Take 0.5 mg by nebulization every 6 (six) hours as needed for wheezing or shortness of breath.     levocetirizine (XYZAL) 5 MG tablet Take 5 mg by mouth once a week.  3   LORazepam (ATIVAN) 0.5 MG tablet Take 0.5 mg by mouth 2 (two) times daily as needed for anxiety.   0   montelukast (SINGULAIR) 10 MG tablet Take 10 mg by mouth daily.  2   omeprazole (PRILOSEC) 20 MG capsule Take 1 capsule (20 mg total) by mouth daily. 30 capsule 5   SYMBICORT 160-4.5 MCG/ACT inhaler Inhale 1 puff into the lungs daily as needed (shortness of breath).   0   XARELTO 20 MG TABS tablet Take 20 mg by mouth daily.  3   predniSONE (DELTASONE) 20 MG tablet Start Prednisone: 80mg  daily for 4 days, 60mg  daily for 4 days, 40mg  daily for 4 days, 20mg  daily for 4 days, then stop (Patient not taking: Reported on 01/27/2023) 40 tablet 0   Current Facility-Administered Medications  Medication Dose Route Frequency Provider Last Rate Last Admin   dupilumab (DUPIXENT) prefilled syringe 600 mg  600 mg Subcutaneous Once Ferol Luz, MD       Allergies: Allergies  Allergen Reactions   Egg-Derived Products Anaphylaxis and Hives    Iodine Hives    With contrast dye - had hives, throat felt scratchy and some trouble breathing   Other Anaphylaxis and Hives    All seafood   Shellfish Allergy Anaphylaxis and Hives   Iohexol Hives   I reviewed her past medical history, social history, family history, and environmental history and no significant changes have been reported from her previous visit.  ROS: All others negative except as noted per HPI.   Objective: BP 120/60   Pulse (!) 113   Temp 97.6 F (36.4 C) (Temporal)   Resp 17   SpO2 100%  There is no height or weight on file to calculate BMI. General Appearance:  Alert, cooperative, no distress, appears stated age  Head:  Normocephalic, without obvious abnormality, atraumatic  Eyes:  Conjunctiva clear, EOM's intact  Nose: Nares normal,   Throat: Lips, tongue normal; teeth and gums normal,   Neck: Supple, symmetrical  Lungs:   , Respirations unlabored, no coughing  Heart:  , Appears  well perfused  Extremities: No edema  Skin: Skin color, texture, turgor normal, "erythema on bilateral hands, no eczematous patches on neck   Neurologic: No gross deficits   Previous notes and tests were reviewed. The plan was reviewed with the patient/family, and all questions/concerned were addressed.  It was my pleasure to see Shereta today and participate in her care. Please feel free to contact me with any questions or concerns.  Sincerely,  Ferol Luz, MD  Allergy & Immunology  [facility] [place] Office: [phone]

## 2023-01-27 NOTE — Patient Instructions (Addendum)
Atopic Dermatitis: not well controlled, severe BSA 10%  - Labs showed pansensitization to all the environmentals with high IgE (allergy antibody) - Negative labs: inflammatory makers, autoantibodies for autoimmune skin diseases such as dermatomyositis  - Based on labs, this rash is atopic dermatitis which is severe and uncontrolled - We we will start Dupixent, 600 mg loading dose given today via sample, you will continue to receive 300 mg subcutaneously every 2 weeks - Our biologic coordinator Tammy will reach out to you on the approval process - Continue basic skin care and topical steroids as below   - Once skin is improved we will consider allergy testing for updating egg and shellfish allergen as well as any additional environmental testing needed - Continue Symbicort 160 mcg 1 puff daily for asthma   Follow up in 8 weeks  Thank you so much for letting me partake in your care today.  Don't hesitate to reach out if you have any additional concerns!  Ferol Luz, MD  [facility] [place] [place]  Skin Care: Daily Care For Maintenance (daily and continue even once eczema controlled) - Recommend hypoallergenic hydrating ointment at least twice daily.  This must be done daily for control of flares. (Great options include Vaseline, CeraVe, Aquaphor, Aveeno, Cetaphil, VaniCream, etc) - Recommend avoiding detergents, soaps or lotions with fragrances/dyes, and instead using products which are hypoallergenic, use second rinse cycle when washing clothes -Wear lose breathable clothing, avoid wool,  avoid any jackets with tight and rough fabric around wrists or neck  -Avoid extremes of humidity - Limit showers/baths to 5 minutes and use luke warm water instead of hot, pat dry following baths, and apply moisturizer -Start Zyrtec (cetirizine) 10 mg twice daily to help with itch   For Flares:(add this to maintenance therapy if needed for flares) - Clobetasol 0.5% to body for  severe flares-apply topically twice daily to red, raised, thickened areas of skin, followed by moisturizer  -As lesions start to heal, stepdown to triamcinolone 0.1% ointment

## 2023-01-28 DIAGNOSIS — Z91012 Allergy to eggs: Secondary | ICD-10-CM | POA: Insufficient documentation

## 2023-01-28 DIAGNOSIS — L2089 Other atopic dermatitis: Secondary | ICD-10-CM | POA: Insufficient documentation

## 2023-01-28 DIAGNOSIS — Z91013 Allergy to seafood: Secondary | ICD-10-CM | POA: Insufficient documentation

## 2023-02-01 ENCOUNTER — Ambulatory Visit (INDEPENDENT_AMBULATORY_CARE_PROVIDER_SITE_OTHER): Payer: 59 | Admitting: Internal Medicine

## 2023-02-01 ENCOUNTER — Other Ambulatory Visit: Payer: Self-pay | Admitting: Internal Medicine

## 2023-02-01 ENCOUNTER — Other Ambulatory Visit: Payer: Self-pay

## 2023-02-01 ENCOUNTER — Encounter: Payer: Self-pay | Admitting: Internal Medicine

## 2023-02-01 VITALS — BP 122/86 | HR 108 | Temp 98.4°F | Resp 18

## 2023-02-01 DIAGNOSIS — J4530 Mild persistent asthma, uncomplicated: Secondary | ICD-10-CM | POA: Diagnosis not present

## 2023-02-01 DIAGNOSIS — L2089 Other atopic dermatitis: Secondary | ICD-10-CM

## 2023-02-01 MED ORDER — METHYLPREDNISOLONE ACETATE 80 MG/ML IJ SUSP
80.0000 mg | Freq: Once | INTRAMUSCULAR | Status: AC
  Administered 2023-02-01: 80 mg via INTRAMUSCULAR

## 2023-02-01 MED ORDER — PREDNISONE 10 MG PO TABS: ORAL_TABLET | ORAL | 0 refills | Status: DC

## 2023-02-01 NOTE — Patient Instructions (Addendum)
Atopic Dermatitis: not well controlled, severe BSA 10%  - Labs showed pansensitization to all the environmentals with high IgE (allergy antibody) - Negative labs: inflammatory makers, autoantibodies for autoimmune skin diseases such as dermatomyositis  - Based on labs, this rash is atopic dermatitis which is severe and uncontrolled - continue dupixent - let us know if you have not heard from her by the end of this week - Continue basic skin care and topical steroids as below - IM 80 mg depomedrol today in clinic - prednisone taper start at 60 mg daily (6 tablets) by mouth for 3 days then 50 mg daily (5 tablets) for 3 days  then 40 mg daily (4 tablets) for 3 days  then 30 mg (3 tablets) daily for 3 days then 20 mg daily (2 tablets) for 3 days then 10 mg daily (1 tablet) for 3 days then stop.    - Consider dermatology referral for biopsy if no improvement with duipxent - Continue Symbicort 160 mcg 1 puff daily for asthma   Follow up in 2-4 weeks with Dr. Marlynn Perking Thank you so much for letting me partake in your care today.  Don't hesitate to reach out if you have any additional concerns!  Tonny Bollman, MD [facility] [place]   Skin Care: Daily Care For Maintenance (daily and continue even once eczema controlled) - Recommend hypoallergenic hydrating ointment at least twice daily.  This must be done daily for control of flares. (Great options include Vaseline, CeraVe, Aquaphor, Aveeno, Cetaphil, VaniCream, etc) - Recommend avoiding detergents, soaps or lotions with fragrances/dyes, and instead using products which are hypoallergenic, use second rinse cycle when washing clothes -Wear lose breathable clothing, avoid wool,  avoid any jackets with tight and rough fabric around wrists or neck  -Avoid extremes of humidity - Limit showers/baths to 5 minutes and use luke warm water instead of hot, pat dry following baths, and apply moisturizer -Start Zyrtec (cetirizine) 10 mg twice daily  to help with itch   For Flares:(add this to maintenance therapy if needed for flares) - Clobetasol 0.5% to body for severe flares-apply topically twice daily to red, raised, thickened areas of skin, followed by moisturizer  -As lesions start to heal, stepdown to triamcinolone 0.1% ointment

## 2023-02-01 NOTE — Progress Notes (Signed)
FOLLOW UP Date of Service/Encounter:  02/01/23   Subjective:  Melinda Guerrero (DOB: 1971/07/28) is a 52 y.o. female who [facility] in re-evaluation of the following: acute visit for rash History obtained from: chart review and patient.  For Review, LV was on 01/27/23  with Dr. Marlynn Perking seen for routine follow-up. See below for summary of history and diagnostics.  Therapeutic plans/changes recommended:  "Atopic Dermatitis: not well controlled, severe BSA 10%  - Labs showed pansensitization to all the environmentals with high IgE (allergy antibody) - Negative labs: inflammatory makers, autoantibodies for autoimmune skin diseases such as dermatomyositis  - Based on labs, this rash is atopic dermatitis which is severe and uncontrolled" Dupixent started -loading dose given, prednisone taper provided.  Today presents for follow-up. Taking zyrtec 4 times daily since Monday. Benadryl at night to help with sleep Rash controlled from last visit when on prednisone until had to stop yesteray. Rash now back on neck, face and hands. Extermely painful.  She states she would like to "cut off her skin".  Notes poor quality of life with current symptoms. She would like to restart prednisone.  Stopped hydrocortisone and triamcinolone PRN. Had to stop because hands bleading. Troubl e sleeping due to scratching/itching. Otherwise breathing doing well.    Allergies as of 02/01/2023       Reactions   Egg-derived Products Anaphylaxis, Hives   Iodine Hives   With contrast dye - had hives, throat felt scratchy and some trouble breathing   Other Anaphylaxis, Hives   All seafood   Shellfish Allergy Anaphylaxis, Hives   Iohexol Hives        Medication List        Accurate as of February 01, 2023  5:52 PM. If you have any questions, ask your nurse or doctor.          albuterol (2.5 MG/3ML) 0.083% nebulizer solution Commonly known as:  PROVENTIL Inhale into the lungs.   albuterol 108 (90 Base) MCG/ACT inhaler Commonly known as: VENTOLIN HFA Inhale 1-2 puffs into the lungs every 4 (four) hours as needed for wheezing or shortness of breath.   cetirizine 10 MG tablet Commonly known as: ZYRTEC Take 10 mg by mouth daily.   doxycycline 100 MG tablet Commonly known as: VIBRA-TABS Take 100 mg by mouth 2 (two) times daily.   HYDROcodone-acetaminophen 5-325 MG tablet Commonly known as: NORCO/VICODIN Take 1-2 tablets by mouth every 4 (four) hours as needed for moderate pain.   hydrOXYzine 25 MG tablet Commonly known as: ATARAX Take 25 mg by mouth 3 (three) times daily.   ipratropium 0.02 % nebulizer solution Commonly known as: ATROVENT Take 0.5 mg by nebulization every 6 (six) hours as needed for wheezing or shortness of breath.   levocetirizine 5 MG tablet Commonly known as: XYZAL Take 5 mg by mouth once a week.   LORazepam 0.5 MG tablet Commonly known as: ATIVAN Take 0.5 mg by mouth 2 (two) times daily as needed for anxiety.   montelukast 10 MG tablet Commonly known as: SINGULAIR Take 10 mg by mouth daily.   omeprazole 20 MG capsule Commonly known as: PRILOSEC Take 1 capsule (20 mg total) by mouth daily.   predniSONE 20 MG tablet Commonly known as: DELTASONE Start Prednisone:  daily for 4 days,  daily for 4 days,  daily for 4 days,  daily for 4 days, then stop What changed: Another medication with the same name was added. Make sure  you understand how and when to take each. Changed by: Verlee Monte, MD   predniSONE 10 MG tablet Commonly known as: DELTASONE 60 mg daily (6 tablets) by mouth for 3 days then 50 mg daily (5 tablets) for 3 days  then 40 mg daily (4 tablets) for 3 days  then 30 mg (3 tablets) daily for 3 days then 20 mg daily (2 tablets) for 3 days then 10 mg daily (1 tablet) for 3 days then stop. What changed: You were already taking a medication with the same name, and this  prescription was added. Make sure you understand how and when to take each. Changed by: Verlee Monte, MD   Symbicort 160-4.5 MCG/ACT inhaler Generic drug: budesonide-formoterol Inhale 1 puff into the lungs daily as needed (shortness of breath).   Xarelto 20 MG Tabs tablet Generic drug: rivaroxaban Take 20 mg by mouth daily.       Past Medical History:  Diagnosis Date   Angio-edema    Arthritis    left hand after carpal tunnel release   Asthma    Celiac disease    Eczema    Osteoarthritis resulting from right hip dysplasia 05/07/2015   Pneumonia    PONV (postoperative nausea and vomiting)    Urticaria    Past Surgical History:  Procedure Laterality Date   BREAST SURGERY     breast biopsy (3 clips on right breast)   CARPAL TUNNEL RELEASE Left    CARPAL TUNNEL RELEASE Right    left leg surgery     tendons and nerves removed to use to rebuild hand   TOTAL HIP ARTHROPLASTY Right 05/07/2015   Procedure: TOTAL HIP ARTHROPLASTY;  Surgeon: Teryl Lucy, MD;  Location: [facility] OR;  Service: Orthopedics;  Laterality: Right;   Otherwise, there have been no changes to her past medical history, surgical history, family history, or social history.  ROS: All others negative except as noted per HPI.   Objective:  BP 122/86 (BP Location: Left Arm, Patient Position: Sitting, Cuff Size: Normal)   Pulse (!) 108   Temp 98.4 F (36.9 C) (Temporal)   Resp 18   SpO2 97%  There is no height or weight on file to calculate BMI. Physical Exam: General Appearance:  Alert, cooperative, no distress, appears stated age  Head:  Normocephalic, without obvious abnormality, atraumatic  Eyes:  Conjunctiva clear, EOM's intact  Nose: Nares normal,  no visible rhinorrhea  Throat: Lips, tongue normal; teeth and gums normal,  dry mucus membranes  Neck: Supple, symmetrical  Lungs:   clear to auscultation bilaterally, Respirations unlabored, no coughing  Heart:  regular rate and rhythm and no murmur,  Appears well perfused  Extremities: No edema  Skin: Erythematous macular rash in glove and lower face surrounding mouth with mild scaling above lips and neck  Neurologic: No gross deficits   Assessment/Plan   Atopic Dermatitis: not well controlled, severe BSA 10% - ACUTE FLARE 2/2 prednisone tapered off - Labs showed pansensitization to all the environmentals with high IgE (allergy antibody) - Negative labs: inflammatory makers, autoantibodies for autoimmune skin diseases such as dermatomyositis  - Based on labs, this rash is atopic dermatitis which is severe and uncontrolled - continue dupixent - let us know if you have not heard from her by the end of this week - Continue basic skin care and topical steroids as below - IM 80 mg depomedrol today in clinic - prednisone taper start at 60 mg daily (6 tablets) by mouth  for 3 days then 50 mg daily (5 tablets) for 3 days  then 40 mg daily (4 tablets) for 3 days  then 30 mg (3 tablets) daily for 3 days then 20 mg daily (2 tablets) for 3 days then 10 mg daily (1 tablet) for 3 days then stop.    - Consider dermatology referral for biopsy if no improvement with duipxent - Continue Symbicort 160 mcg 1 puff daily for asthma   Follow up in 2-4 weeks with Dr. Marlynn Perking Thank you so much for letting me partake in your care today.  Don't hesitate to reach out if you have any additional concerns!  Tonny Bollman, MD  [facility]

## 2023-02-08 ENCOUNTER — Telehealth: Payer: Self-pay | Admitting: *Deleted

## 2023-02-08 NOTE — Telephone Encounter (Signed)
Patient called to check status of Dupixent. I called and l/m for patient Ins denied coverage and I have resubmitted for approval and will reach out once decision made

## 2023-02-08 NOTE — Telephone Encounter (Signed)
-----   Message from Ferol Luz, MD sent at 01/28/2023  2:19 PM EDT ----- I would like to start this patient on dupixent  every 4 weeks for severe atopic dermatitis

## 2023-02-15 NOTE — Telephone Encounter (Signed)
Ins denied patient dupixent has to have trial and failure of elidel, protopic or eucrisa. Please advise

## 2023-02-16 ENCOUNTER — Other Ambulatory Visit (HOSPITAL_COMMUNITY): Payer: Self-pay

## 2023-02-16 ENCOUNTER — Other Ambulatory Visit: Payer: Self-pay | Admitting: Internal Medicine

## 2023-02-16 ENCOUNTER — Telehealth: Payer: Self-pay

## 2023-02-16 MED ORDER — TACROLIMUS 0.1 % EX OINT: TOPICAL_OINTMENT | Freq: Two times a day (BID) | CUTANEOUS | 0 refills | Status: DC

## 2023-02-16 NOTE — Addendum Note (Signed)
Addended by: Berna Bue on: 02/16/2023 02:19 PM   Modules accepted: Orders

## 2023-02-16 NOTE — Telephone Encounter (Signed)
Pt informed protopic sent to [facility] drug. She will pick it up and let us know if this helps or not.

## 2023-02-16 NOTE — Telephone Encounter (Signed)
Lm for pt to call us back  

## 2023-02-16 NOTE — Telephone Encounter (Signed)
Sent in 90g as its covered

## 2023-02-16 NOTE — Telephone Encounter (Signed)
Can we add protopic to patients topical regimen.  I will see her on 02/23/23 and she can let me know if it is affective and we can resubmit.  Prescription has been placed.  Can someone let patient know?

## 2023-02-16 NOTE — Telephone Encounter (Signed)
Patient Advocate Encounter   Received notification from [employer] that prior authorization is required for Tacrolimus 0.1% ointment   Submitted: n/a Key [id number]   This medication does not require a prior authorization. Insurance will pay for a maximum of 90 g per 30 days

## 2023-02-16 NOTE — Telephone Encounter (Signed)
Left message for patient to return call. The protopic was sent in.

## 2023-02-23 ENCOUNTER — Encounter: Payer: Self-pay | Admitting: Internal Medicine

## 2023-02-23 ENCOUNTER — Ambulatory Visit (INDEPENDENT_AMBULATORY_CARE_PROVIDER_SITE_OTHER): Payer: 59 | Admitting: Internal Medicine

## 2023-02-23 VITALS — BP 134/82 | HR 118 | Temp 97.7°F | Resp 18

## 2023-02-23 DIAGNOSIS — L2089 Other atopic dermatitis: Secondary | ICD-10-CM | POA: Diagnosis not present

## 2023-02-23 DIAGNOSIS — Z91013 Allergy to seafood: Secondary | ICD-10-CM

## 2023-02-23 DIAGNOSIS — J4530 Mild persistent asthma, uncomplicated: Secondary | ICD-10-CM

## 2023-02-23 DIAGNOSIS — Z91012 Allergy to eggs: Secondary | ICD-10-CM

## 2023-02-23 MED ORDER — DUPILUMAB 300 MG/2ML ~~LOC~~ SOSY
300.0000 mg | PREFILLED_SYRINGE | SUBCUTANEOUS | Status: AC
Start: 2023-02-23 — End: ?
  Administered 2023-02-23: 300 mg via SUBCUTANEOUS

## 2023-02-23 NOTE — Patient Instructions (Addendum)
Atopic Dermatitis: not well controlled, severe BSA 10%  - Labs showed pansensitization to all the environmentals with high IgE (allergy antibody) - Negative labs: inflammatory makers, autoantibodies for autoimmune skin diseases such as dermatomyositis  - Based on labs, this rash is atopic dermatitis which is severe and uncontrolled - continue dupixent -  I will let Tammy know about protopic failure to expedite approval  - Continue basic skin care and topical steroids as below - Continue zyrtec 10mg  up to 4 times a day  - Can use additional dose of benadryl for breakthrough symptoms  - Consider dermatology referral for biopsy if no improvement with duipxent   - Continue Symbicort 160 mcg 1 puff daily for asthma  -Continue to avoid egg and shellfish and carry EpiPen - We will need to update egg and shellfish testing at some point   Follow up: 2 months, tammy will reach out to you about resubmitting for dupixent   Thank you so much for letting me partake in your care today.  Don't hesitate to reach out if you have any additional concerns!  Ferol Luz, MD  [facility] [place] [place]    Skin Care: Daily Care For Maintenance (daily and continue even once eczema controlled) - Recommend hypoallergenic hydrating ointment at least twice daily.  This must be done daily for control of flares. (Great options include Vaseline, CeraVe, Aquaphor, Aveeno, Cetaphil, VaniCream, etc) - Recommend avoiding detergents, soaps or lotions with fragrances/dyes, and instead using products which are hypoallergenic, use second rinse cycle when washing clothes -Wear lose breathable clothing, avoid wool,  avoid any jackets with tight and rough fabric around wrists or neck  -Avoid extremes of humidity - Limit showers/baths to 5 minutes and use luke warm water instead of hot, pat dry following baths, and apply moisturizer - Continue tacrolimus twice a day   For Flares:(add this to maintenance  therapy if needed for flares) - Clobetasol 0.5% to body for severe flares-apply topically twice daily to red, raised, thickened areas of skin, followed by moisturizer  -As lesions start to heal, stepdown to triamcinolone 0.1% ointment

## 2023-02-23 NOTE — Progress Notes (Signed)
Follow Up Note  RE: Melinda Guerrero MRN: [id number] DOB: January 09, 1971 Date of Office Visit: 02/23/2023  Referring provider: Marylen Ponto, DO Primary care provider: Marylen Ponto, DO  Chief Complaint: Urticaria and Eczema  History of Present Illness: I had the pleasure of seeing Melinda Guerrero for a follow up visit at the [facility] on 02/26/2023. She is a 52 y.o. female, who is being followed for dermatitis, food allergy, asthma . Her previous allergy office visit was on 02/01/23 with Dr. Maurine Minister. Today is a regular follow up visit.  History obtained from patient, chart review.  At last visit she had acute recurrent of symptoms was and was started on prolonged prednisone taper and given 80 mg Kenalog injection..  Dupixent approval was put on hold until failure of calcineurin inhibitors.  She was prescribed Protopic.  Today she reports  Improvement in symptoms with Kenalog injection prednisone.  Has noticed increase in pruritus since stopping prednisone a few days ago.  She picked up tacrolimus.  Her topical steroid without any improvement in her symptoms.  She is interested in Dupixent again.   Asthma is well controlled with symbicort 2 puffs twice daily.  Denies any albuterol use in past 30 days.  No ABX or OCS for asthma in past 12 months  She continues to avoid egg and shellfish in all forms. Has epipen and denies any accidental ingestions or reactions since last visit.   Assessment and Plan: Laniaya is a 52 y.o. female with: Other atopic dermatitis - Plan: dupilumab (DUPIXENT) prefilled syringe 300 mg  Mild persistent asthma without complication  History of allergy to eggs  History of allergy to shellfish   Plan: Patient Instructions  Atopic Dermatitis: not well controlled, severe BSA 10%  - Labs showed pansensitization to all the environmentals with high IgE (allergy antibody) - Negative labs: inflammatory makers, autoantibodies for  autoimmune skin diseases such as dermatomyositis  - Based on labs, this rash is atopic dermatitis which is severe and uncontrolled - continue dupixent -  I will let Tammy know about protopic failure to expedite approval  - Continue basic skin care and topical steroids as below - Continue zyrtec 10mg  up to 4 times a day  - Can use additional dose of benadryl for breakthrough symptoms  - Consider dermatology referral for biopsy if no improvement with duipxent   - Continue Symbicort 160 mcg 1 puff daily for asthma  -Continue to avoid egg and shellfish and carry EpiPen - We will need to update egg and shellfish testing at some point   Follow up: 2 months, tammy will reach out to you about resubmitting for dupixent   Thank you so much for letting me partake in your care today.  Don't hesitate to reach out if you have any additional concerns!  Ferol Luz, MD  [facility] [place] [place]    Skin Care: Daily Care For Maintenance (daily and continue even once eczema controlled) - Recommend hypoallergenic hydrating ointment at least twice daily.  This must be Guerrero daily for control of flares. (Great options include Vaseline, CeraVe, Aquaphor, Aveeno, Cetaphil, VaniCream, etc) - Recommend avoiding detergents, soaps or lotions with fragrances/dyes, and instead using products which are hypoallergenic, use second rinse cycle when washing clothes -Wear lose breathable clothing, avoid wool,  avoid any jackets with tight and rough fabric around wrists or neck  -Avoid extremes of humidity - Limit showers/baths to 5 minutes and use luke warm water instead  of hot, pat dry following baths, and apply moisturizer - Continue tacrolimus twice a day   For Flares:(add this to maintenance therapy if needed for flares) - Clobetasol 0.5% to body for severe flares-apply topically twice daily to red, raised, thickened areas of skin, followed by moisturizer  -As lesions start to heal, stepdown  to triamcinolone 0.1% ointment   Meds ordered this encounter  Medications   dupilumab (DUPIXENT) prefilled syringe 300 mg    Lab Orders  No laboratory test(s) ordered today   Diagnostics: None Guerrero    Medication List:  Current Outpatient Medications  Medication Sig Dispense Refill   albuterol (PROVENTIL) (2.5 MG/3ML) 0.083% nebulizer solution Inhale into the lungs.     albuterol (VENTOLIN HFA) 108 (90 Base) MCG/ACT inhaler Inhale 1-2 puffs into the lungs every 4 (four) hours as needed for wheezing or shortness of breath.     ALPRAZolam (XANAX) 1 MG tablet Take 1 mg by mouth at bedtime.     augmented betamethasone dipropionate (DIPROLENE-AF) 0.05 % cream Apply topically.     cetirizine (ZYRTEC) 10 MG tablet Take 10 mg by mouth daily.     doxycycline (VIBRA-TABS) 100 MG tablet Take 100 mg by mouth 2 (two) times daily.     DULoxetine (CYMBALTA) 60 MG capsule Take by mouth.     hydrOXYzine (ATARAX) 25 MG tablet Take 25 mg by mouth 3 (three) times daily.     ipratropium (ATROVENT) 0.02 % nebulizer solution Take 0.5 mg by nebulization every 6 (six) hours as needed for wheezing or shortness of breath.     levocetirizine (XYZAL) 5 MG tablet Take 5 mg by mouth once a week.  3   levonorgestrel (MIRENA, 52 MG,) 20 MCG/DAY IUD by Intrauterine route.     LORazepam (ATIVAN) 0.5 MG tablet Take 0.5 mg by mouth 2 (two) times daily as needed for anxiety.   0   Magnesium Gluconate 550 MG TABS Take by mouth.     montelukast (SINGULAIR) 10 MG tablet Take 10 mg by mouth daily.  2   omeprazole (PRILOSEC) 20 MG capsule Take 1 capsule (20 mg total) by mouth daily. 30 capsule 5   SYMBICORT 160-4.5 MCG/ACT inhaler Inhale 1 puff into the lungs daily as needed (shortness of breath).   0   tacrolimus (PROTOPIC) 0.1 % ointment Apply topically 2 (two) times daily. 90 g 0   XARELTO 20 MG TABS tablet Take 20 mg by mouth daily.  3   Current Facility-Administered Medications  Medication Dose Route Frequency  Provider Last Rate Last Admin   dupilumab (DUPIXENT) prefilled syringe 300 mg  300 mg Subcutaneous Q14 Days Ferol Luz, MD   300 mg at 02/23/23 1618   dupilumab (DUPIXENT) prefilled syringe 600 mg  600 mg Subcutaneous Once Ferol Luz, MD       Allergies: Allergies  Allergen Reactions   Egg-Derived Products Anaphylaxis and Hives   Iodine Hives    With contrast dye - had hives, throat felt scratchy and some trouble breathing   Other Anaphylaxis and Hives    All seafood   Shellfish Allergy Anaphylaxis and Hives   Iohexol Hives   I reviewed her past medical history, social history, family history, and environmental history and no significant changes have been reported from her previous visit.  ROS: All others negative except as noted per HPI.   Objective: BP 134/82   Pulse (!) 118   Temp 97.7 F (36.5 C) (Temporal)   Resp 18   SpO2 97%  There is no height or weight on file to calculate BMI. General Appearance:  Alert, cooperative, no distress, appears stated age  Head:  Normocephalic, without obvious abnormality, atraumatic  Eyes:  Conjunctiva clear, EOM's intact  Nose: Nares normal,   Throat: Lips, tongue normal; teeth and gums normal,   Neck: Supple, symmetrical  Lungs:   , Respirations unlabored, no coughing  Heart:  , Appears well perfused  Extremities: No edema  Skin: Skin color, texture, turgor normal,  generalized erythema, improved from prior  Neurologic: No gross deficits   Previous notes and tests were reviewed. The plan was reviewed with the patient/family, and all questions/concerned were addressed.  It was my pleasure to see Melinda Guerrero today and participate in her care. Please feel free to contact me with any questions or concerns.  Sincerely,  Ferol Luz, MD  Allergy & Immunology  [facility] Office: [phone]

## 2023-03-09 ENCOUNTER — Telehealth: Payer: Self-pay | Admitting: *Deleted

## 2023-03-09 NOTE — Telephone Encounter (Signed)
Patient had called reference status of approval and I advised her I had to appeal and awaiting decision. She advised she did get approval in mail yesterday and will email to me this afternoon

## 2023-03-09 NOTE — Telephone Encounter (Signed)
-----   Message from Ferol Luz, MD sent at 02/26/2023  1:27 PM EDT ----- Patient prescribed Protopic in addition to her topical steroids for atopic dermatitis.  No improvement.  Can we resubmit for Dupixent.

## 2023-03-10 NOTE — Telephone Encounter (Signed)
Thanks

## 2023-03-10 NOTE — Telephone Encounter (Signed)
Patient emailed the approval she received in mail and I have submitted her Rx to [employer] with instrux for delviery, storage and dosing for same

## 2023-04-22 IMAGING — MG MM DIGITAL SCREENING BILAT W/ TOMO AND CAD
6 of 10 series · 6 of 30 positions shown · non-contrast
Comparison: Previous exam(s).

CLINICAL DATA: Screening.

EXAM:
DIGITAL SCREENING BILATERAL MAMMOGRAM WITH TOMOSYNTHESIS AND CAD
TECHNIQUE: Bilateral screening digital craniocaudal and mediolateral oblique
mammograms were obtained. Bilateral screening digital breast
tomosynthesis was performed. The images were evaluated with
computer-aided detection.

[R CC synth-2D]
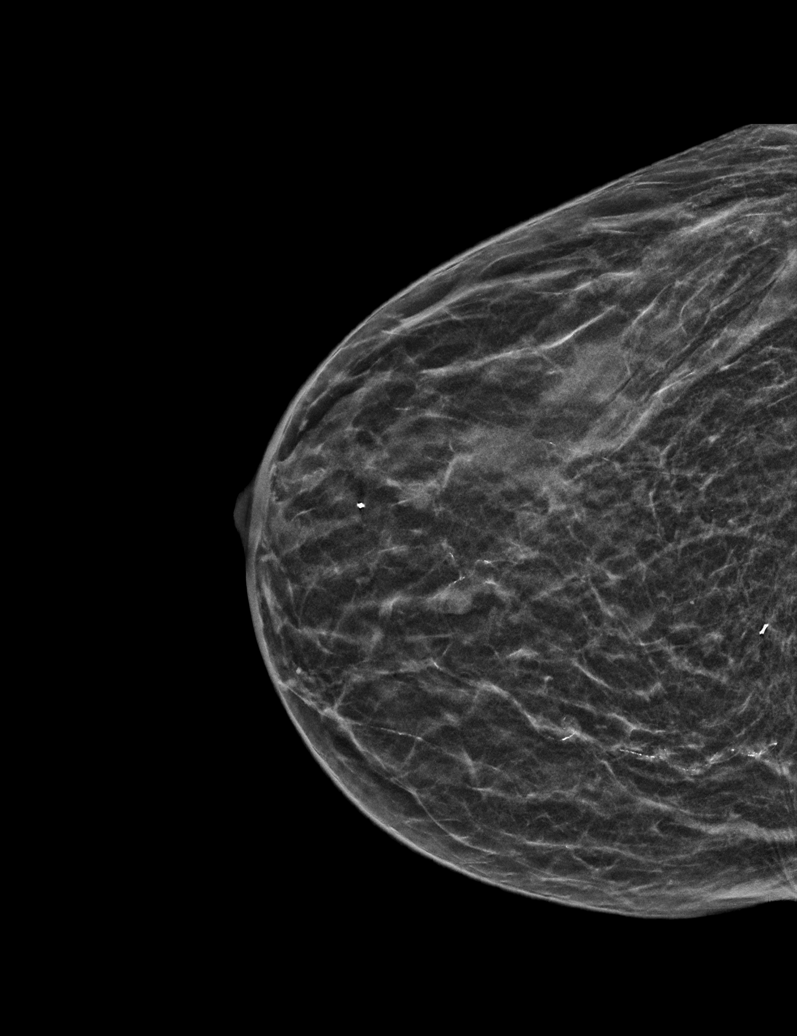

[L CC synth-2D]
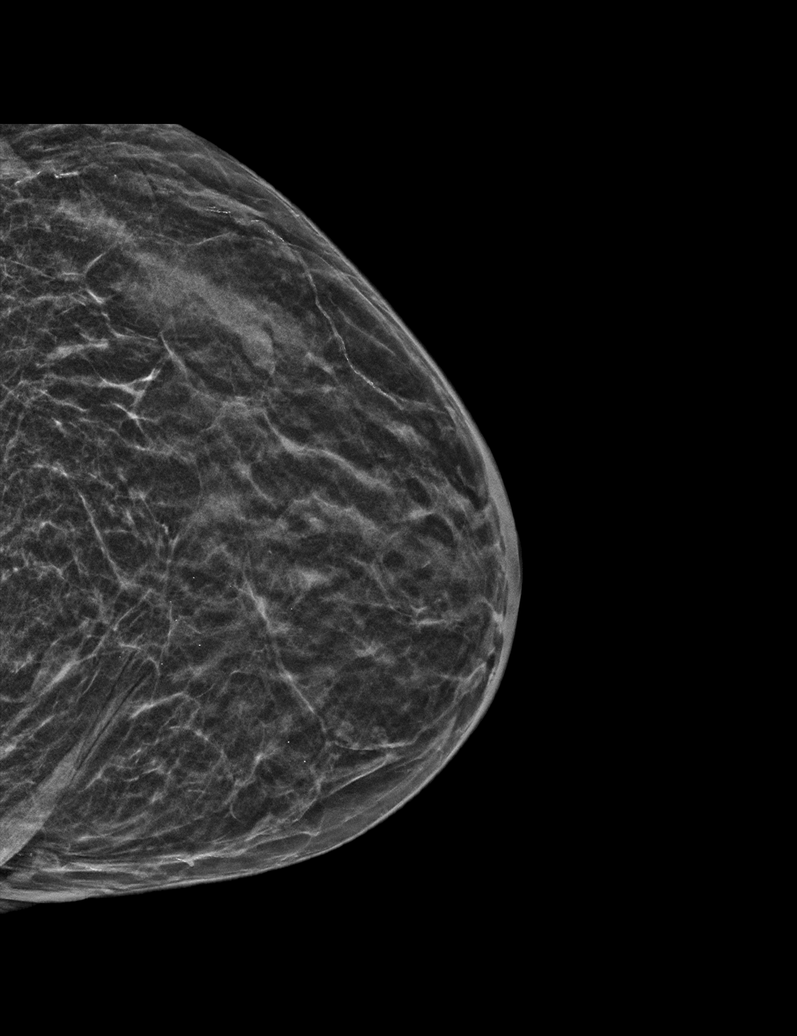

[L MLO synth-2D (1 of 2)]
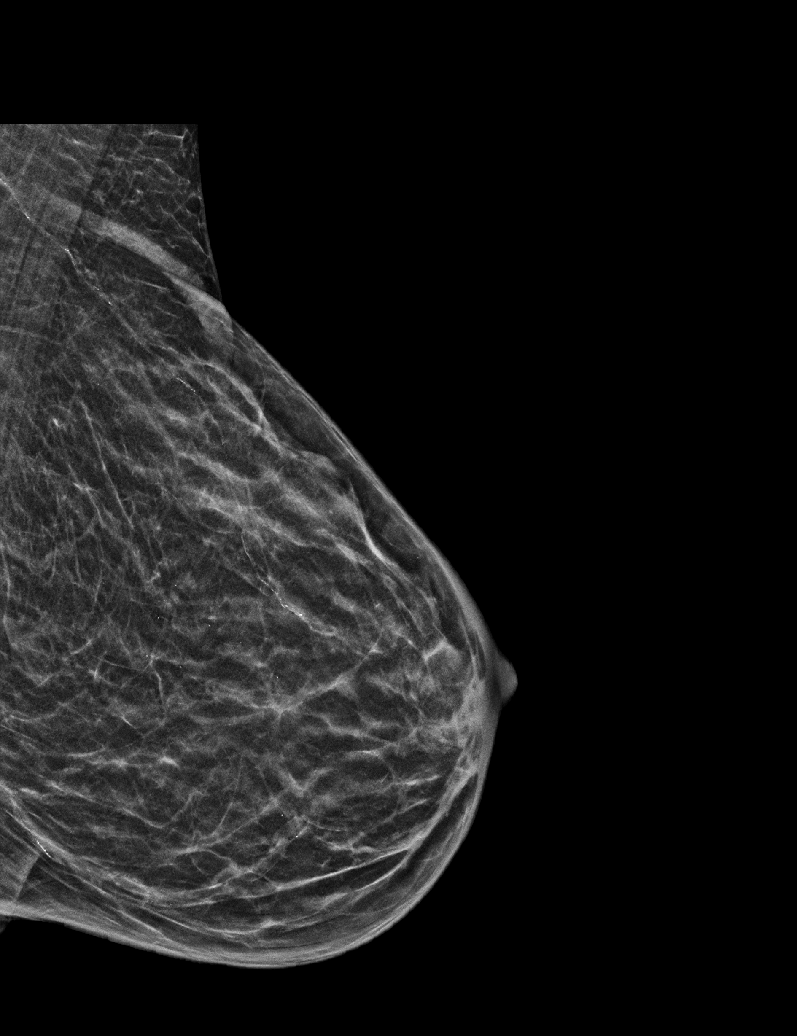

[L MLO synth-2D (2 of 2)]
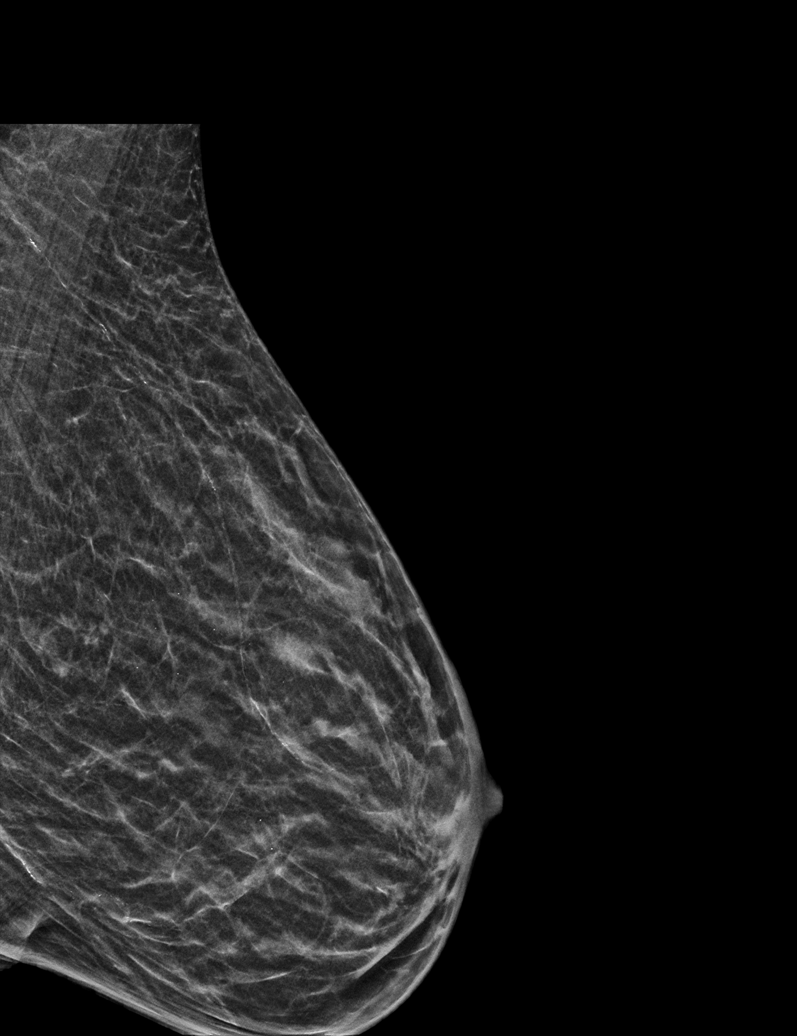

[R MLO synth-2D]
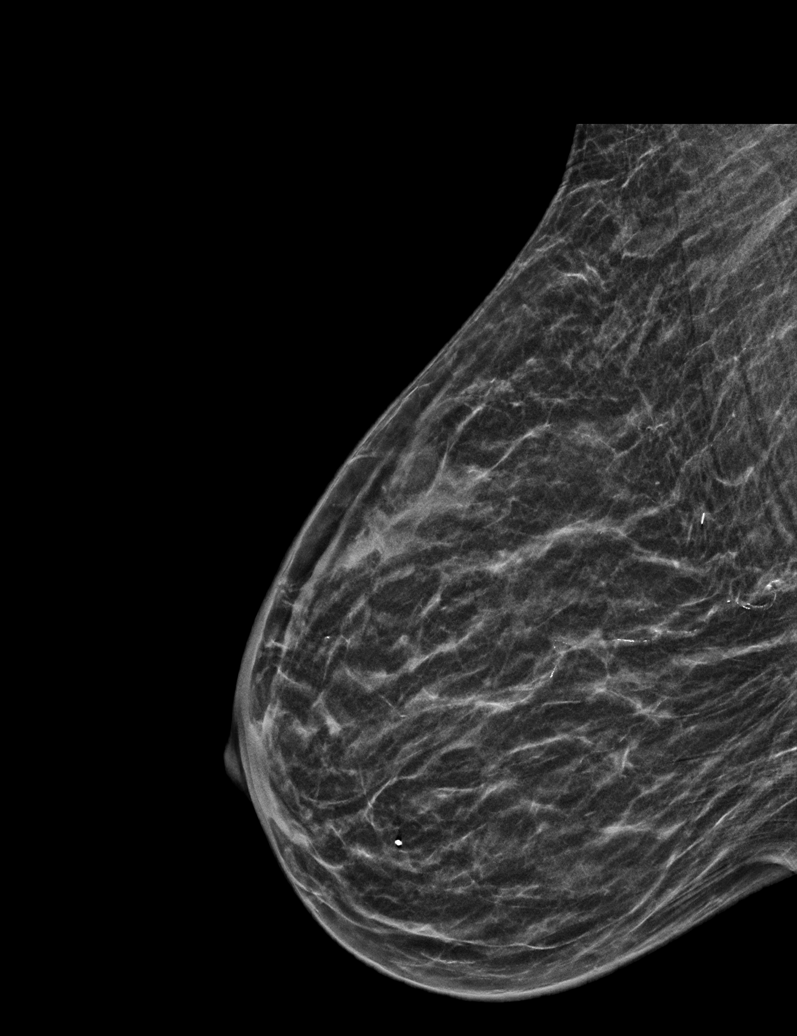

[L MLO tomo · tomo slice 17/33.0]
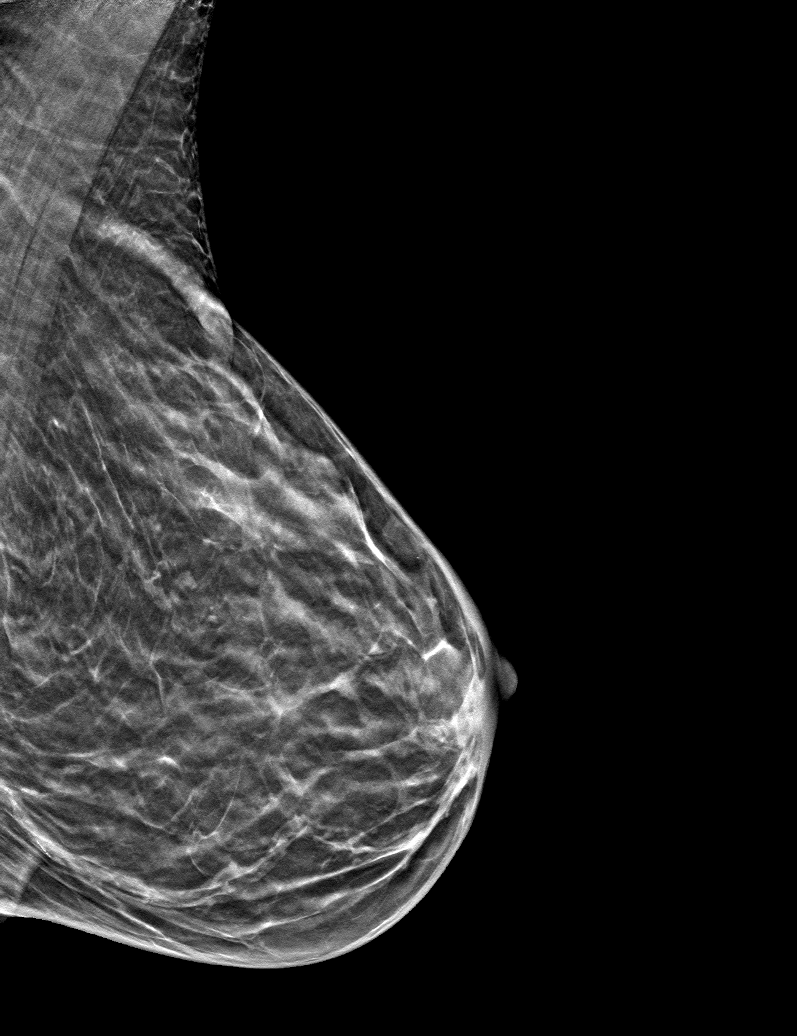

[6 of 30 positions shown; findings below may reference images not displayed]

ACR Breast Density Category b: There are scattered areas of
fibroglandular density.
FINDINGS: There are no findings suspicious for malignancy.
IMPRESSION: No mammographic evidence of malignancy. A result letter of this
screening mammogram will be mailed directly to the patient.

RECOMMENDATION:
Screening mammogram in one year. (Code:51-O-LD2)

BI-RADS CATEGORY  1: Negative.

## 2023-07-13 ENCOUNTER — Other Ambulatory Visit: Payer: Self-pay

## 2023-07-13 MED ORDER — OMEPRAZOLE 20 MG PO CPDR: 20.0000 mg | DELAYED_RELEASE_CAPSULE | Freq: Every day | ORAL | 5 refills | Status: DC

## 2023-08-03 ENCOUNTER — Encounter: Payer: Self-pay | Admitting: Internal Medicine

## 2023-08-03 ENCOUNTER — Ambulatory Visit (INDEPENDENT_AMBULATORY_CARE_PROVIDER_SITE_OTHER): Payer: 59 | Admitting: Internal Medicine

## 2023-08-03 VITALS — BP 126/76 | HR 90 | Temp 97.9°F

## 2023-08-03 DIAGNOSIS — Z91013 Allergy to seafood: Secondary | ICD-10-CM

## 2023-08-03 DIAGNOSIS — J4530 Mild persistent asthma, uncomplicated: Secondary | ICD-10-CM

## 2023-08-03 DIAGNOSIS — Z91012 Allergy to eggs: Secondary | ICD-10-CM | POA: Diagnosis not present

## 2023-08-03 DIAGNOSIS — L2089 Other atopic dermatitis: Secondary | ICD-10-CM | POA: Diagnosis not present

## 2023-08-03 MED ORDER — MONTELUKAST SODIUM 10 MG PO TABS: 10.0000 mg | ORAL_TABLET | Freq: Every day | ORAL | 1 refills | Status: AC

## 2023-08-03 MED ORDER — OPZELURA 1.5 % EX CREA: TOPICAL_CREAM | CUTANEOUS | 5 refills | Status: DC

## 2023-08-03 MED ORDER — ALBUTEROL SULFATE HFA 108 (90 BASE) MCG/ACT IN AERS: 1.0000 | INHALATION_SPRAY | RESPIRATORY_TRACT | 1 refills | Status: DC | PRN

## 2023-08-03 MED ORDER — SYMBICORT 160-4.5 MCG/ACT IN AERO: 1.0000 | INHALATION_SPRAY | Freq: Every day | RESPIRATORY_TRACT | 1 refills | Status: AC | PRN

## 2023-08-03 MED ORDER — OMEPRAZOLE 20 MG PO CPDR: 20.0000 mg | DELAYED_RELEASE_CAPSULE | Freq: Every day | ORAL | 1 refills | Status: DC

## 2023-08-03 NOTE — Progress Notes (Signed)
FOLLOW UP Date of Service/Encounter:  08/03/23  Subjective:  Melinda Guerrero (DOB: 06-19-1971) is a 52 y.o. female who [facility] in re-evaluation of the following: atopic dermatitis  History obtained from: chart review and patient.  For Review, LV was on 02/23/23  with Dr. Marlynn Perking seen for routine follow-up. See below for summary of history and diagnostics.    ---------------------------------------------------- --------------------------------------------------- Today presents for follow-up. Discussed the use of AI scribe software for clinical note transcription with the patient, who gave verbal consent to proceed.  History of Present Illness   The patient, with a history of severe eczema, has been on Dupixent 300mg  every 2 weeks for approximately four months. They report significant improvement in their skin condition, particularly on their arms. However, they still experience some redness on their face, which they describe as painful. The patient also reports that the itchiness has improved significantly. They have been using tacrolimus as a topical treatment, but due to cost, they only have a small tube and no additional topicals at home. They express a reluctance to use steroids unless necessary for their breathing, due to concerns about long-term damage and prolonged systemic and topical steroid this past spring.   In terms of respiratory symptoms, the patient uses Symbicort as needed, which varies depending on weather and pollen counts, but averages two to three times a week. They occasionally wake up with breathing symptoms,  but deny any worsening of these symptoms since discontinuing steroids for their skin.  Nighttime awakening are less than monthly.  They describe the sensation as a feeling of tightness, but deny any wheezing. The Symbicort is reported to be effective in relieving these symptoms.  The patient also mentions being out  of Singulair for over two weeks and requests a refill. They also request a refill of their reflux medication, Prilosec. They have been avoiding egg and shellfish due to allergies, and express a willingness to undergo retesting in the future. They also mention the possibility of patch testing if their facial eczema continues to persist.        All medications reviewed by clinical staff and updated in chart. No new pertinent medical or surgical history except as noted in HPI.  ROS: All others negative except as noted per HPI.   Objective:  BP 126/76   Pulse 90   Temp 97.9 F (36.6 C) (Temporal)   SpO2 98%  There is no height or weight on file to calculate BMI. Physical Exam: General Appearance:  Alert, cooperative, no distress, appears stated age  Head:  Normocephalic, without obvious abnormality, atraumatic  Eyes:  Conjunctiva clear, EOM's intact  Ears   Nose: Nares normal,   Throat: Lips, tongue normal; teeth and gums normal,   Neck: Supple, symmetrical  Lungs:   , Respirations unlabored, no coughing  Heart:  regular rate and rhythm and no murmur, Appears well perfused  Extremities: No edema  Skin: erythematous, dry patches scattered on face and no rashes or lesions on visualized portions of skin -significantly improved  Neurologic: No gross deficits   Labs:  Lab Orders  No laboratory test(s) ordered today    Assessment/Plan   Atopic Dermatitis: Improved, severe BSA 10%  - Labs showed pansensitization to all the environmentals with high IgE (allergy antibody) - Negative labs: inflammatory makers, autoantibodies for autoimmune skin diseases such as dermatomyositis  - continue dupixent  300mg  every 2 weeks  - Continue basic skin care and topical steroids  as below - Continue zyrtec 10mg  up to 4 times a day  - Can use additional dose of benadryl for breakthrough symptoms  - Consider Opzelura twice a day as needed for persistent lesions on face - Will consider patch testing  in the future especially if persistent flares on face or eyelids   Asthma:  mild persistent, well controlled  - Continue Symbicort 160 mcg 1 puff daily as needed for asthma  Food Allergy -Continue to avoid egg and shellfish and carry EpiPen - We will need to update egg and shellfish testing at next visit   Follow up: 6 months  Thank you so much for letting me partake in your care today.  Don't hesitate to reach out if you have any additional concerns!  Ferol Luz, MD  [facility] [place], [place]    Skin Care: Daily Care For Maintenance (daily and continue even once eczema controlled) - Recommend hypoallergenic hydrating ointment at least twice daily.  This must be done daily for control of flares. (Great options include Vaseline, CeraVe, Aquaphor, Aveeno, Cetaphil, VaniCream, etc) - Recommend avoiding detergents, soaps or lotions with fragrances/dyes, and instead using products which are hypoallergenic, use second rinse cycle when washing clothes -Wear lose breathable clothing, avoid wool,  avoid any jackets with tight and rough fabric around wrists or neck  -Avoid extremes of humidity - Limit showers/baths to 5 minutes and use luke warm water instead of hot, pat dry following baths, and apply moisturizer - Continue tacrolimus twice a day   For Flares:(add this to maintenance therapy if needed for flares) - Clobetasol 0.5% to body for severe flares-apply topically twice daily to red, raised, thickened areas of skin, followed by moisturizer  -As lesions start to heal, stepdown to triamcinolone 0.1% ointment  Other:     Thank you so much for letting me partake in your care today.  Don't hesitate to reach out if you have any additional concerns!  Ferol Luz, MD  [facility]- [place], [place]

## 2023-08-03 NOTE — Patient Instructions (Addendum)
Atopic Dermatitis: Improved, severe BSA 10%  - Labs showed pansensitization to all the environmentals with high IgE (allergy antibody) - Negative labs: inflammatory makers, autoantibodies for autoimmune skin diseases such as dermatomyositis  - continue dupixent  300mg  every 2 weeks  - Continue basic skin care and topical steroids as below - Continue zyrtec 10mg  up to 4 times a day  - Can use additional dose of benadryl for breakthrough symptoms  - Consider Opzelura twice a day as needed for persistent lesions on face - Will consider patch testing in the future especially if persistent flares on face or eyelids   Asthma:  - Continue Symbicort 160 mcg 1 puff daily as needed for asthma  Food Allergy -Continue to avoid egg and shellfish and carry EpiPen - We will need to update egg and shellfish testing at next visit   Follow up: 6 months  Thank you so much for letting me partake in your care today.  Don't hesitate to reach out if you have any additional concerns!  Ferol Luz, MD  [facility]- [place], [place]    Skin Care: Daily Care For Maintenance (daily and continue even once eczema controlled) - Recommend hypoallergenic hydrating ointment at least twice daily.  This must be done daily for control of flares. (Great options include Vaseline, CeraVe, Aquaphor, Aveeno, Cetaphil, VaniCream, etc) - Recommend avoiding detergents, soaps or lotions with fragrances/dyes, and instead using products which are hypoallergenic, use second rinse cycle when washing clothes -Wear lose breathable clothing, avoid wool,  avoid any jackets with tight and rough fabric around wrists or neck  -Avoid extremes of humidity - Limit showers/baths to 5 minutes and use luke warm water instead of hot, pat dry following baths, and apply moisturizer - Continue tacrolimus twice a day   For Flares:(add this to maintenance therapy if needed for flares) - Clobetasol 0.5% to body for severe  flares-apply topically twice daily to red, raised, thickened areas of skin, followed by moisturizer  -As lesions start to heal, stepdown to triamcinolone 0.1% ointment

## 2023-08-04 ENCOUNTER — Ambulatory Visit
Admission: RE | Admit: 2023-08-04 | Discharge: 2023-08-04 | Disposition: A | Discharge status: Home / Self Care | Payer: 59 | Source: Ambulatory Visit | Attending: Obstetrics and Gynecology | Admitting: Obstetrics and Gynecology

## 2023-08-04 DIAGNOSIS — Z1231 Encounter for screening mammogram for malignant neoplasm of breast: Secondary | ICD-10-CM

## 2023-08-05 ENCOUNTER — Other Ambulatory Visit: Payer: Self-pay

## 2023-08-05 MED ORDER — ALBUTEROL SULFATE HFA 108 (90 BASE) MCG/ACT IN AERS: 2.0000 | INHALATION_SPRAY | RESPIRATORY_TRACT | 5 refills | Status: AC | PRN

## 2023-08-23 ENCOUNTER — Telehealth: Payer: Self-pay

## 2023-08-23 ENCOUNTER — Other Ambulatory Visit (HOSPITAL_COMMUNITY): Payer: Self-pay

## 2023-08-23 NOTE — Telephone Encounter (Signed)
*  Asthma/Allergy  Pharmacy Patient Advocate Encounter  Received notification from [facility] that Prior Authorization for Opzelura 1.5% cream  has been APPROVED from 08/23/2023 to 08/22/2024. Ran test claim, Copay is $must receive through specialty pharmacy. This test claim was processed through [facility]- copay amounts may vary at other pharmacies due to pharmacy/plan contracts, or as the patient moves through the different stages of their insurance plan.   PA #/Case ID/Reference #: [id number]

## 2024-01-10 ENCOUNTER — Other Ambulatory Visit: Payer: Self-pay | Admitting: *Deleted

## 2024-01-10 MED ORDER — OMEPRAZOLE 20 MG PO CPDR: 20.0000 mg | DELAYED_RELEASE_CAPSULE | Freq: Every day | ORAL | 1 refills | Status: DC

## 2024-01-22 ENCOUNTER — Other Ambulatory Visit: Payer: Self-pay | Admitting: Internal Medicine

## 2024-02-01 ENCOUNTER — Ambulatory Visit: Payer: 59 | Admitting: Internal Medicine

## 2024-02-15 ENCOUNTER — Ambulatory Visit (INDEPENDENT_AMBULATORY_CARE_PROVIDER_SITE_OTHER): Payer: 59 | Admitting: Internal Medicine

## 2024-02-15 VITALS — BP 118/64 | HR 97 | Temp 97.9°F | Resp 18 | Wt 197.1 lb

## 2024-02-15 DIAGNOSIS — Z91013 Allergy to seafood: Secondary | ICD-10-CM | POA: Diagnosis not present

## 2024-02-15 DIAGNOSIS — L2089 Other atopic dermatitis: Secondary | ICD-10-CM

## 2024-02-15 DIAGNOSIS — Z91012 Allergy to eggs: Secondary | ICD-10-CM | POA: Diagnosis not present

## 2024-02-15 DIAGNOSIS — J4530 Mild persistent asthma, uncomplicated: Secondary | ICD-10-CM

## 2024-02-15 NOTE — Progress Notes (Signed)
 FOLLOW UP Date of Service/Encounter:  02/17/24  Subjective:  Melinda Guerrero (DOB: Mar 15, 1971) is a 53 y.o. female who [facility] in re-evaluation of the following: atopic dermatitis  History obtained from: chart review and patient.  For Review, LV was on 08/03/23  with Dr. Jolayne Natter seen for routine follow-up. See below for summary of history and diagnostics.  --------------------------------------------------- Today presents for follow-up. Discussed the use of AI scribe software for clinical note transcription with the patient, who gave verbal consent to proceed.  History of Present Illness        Discussed the use of AI scribe software for clinical note transcription with the patient, who gave verbal consent to proceed.  History of Present Illness Melinda Guerrero is a 53 year old female with eczema and asthma who presents for follow-up on Dupixent  treatment and insurance issues.  She is currently on Dupixent  for eczema, which has significantly improved her skin condition. Her skin feels much better, and she no longer experiences itching, which was previously severe enough to cause distress. However, she is facing issues with her insurance coverage, which has a $10,000 limit that she is about to reach.  She has a history of elevated cholesterol, which influenced the choice of Dupixent  over Rinvoq. Her cholesterol remains elevated, and she is unsure about what dietary changes to make. Heart problems run in her family, which she believes predisposes her to similar issues.  She has asthma and uses Symbicort  as needed for flare-ups. Since January, she has used it only a couple of times, indicating infrequent asthma symptoms. She likens its use to that of a rescue inhaler.  She has a long-standing allergy to eggs and shellfish, which she strictly avoids. She does not consume baked goods containing eggs and has not had any accidental  ingestions in over twenty years.  All medications reviewed by clinical staff and updated in chart. No new pertinent medical or surgical history except as noted in HPI.  ROS: All others negative except as noted per HPI.   Objective:  BP 118/64   Pulse 97   Temp 97.9 F (36.6 C) (Temporal)   Resp 18   Wt 197 lb 1.6 oz (89.4 kg)   SpO2 100%   BMI 35.82 kg/m  Body mass index is 35.82 kg/m. Physical Exam: General Appearance:  Alert, cooperative, no distress, appears stated age  Head:  Normocephalic, without obvious abnormality, atraumatic  Eyes:  Conjunctiva clear, EOM's intact  Ears   Nose: Nares normal,   Throat: Lips, tongue normal; teeth and gums normal,   Neck: Supple, symmetrical  Lungs:   , Respirations unlabored, no coughing  Heart:  regular rate and rhythm and no murmur, Appears well perfused  Extremities: No edema  Skin: Skin color, texture, turgor normal and no rashes or lesions on visualized portions of skin   Neurologic: No gross deficits   Labs:  Lab Orders  No laboratory test(s) ordered today    Assessment/Plan   Atopic Dermatitis: well controlled  - Labs showed pansensitization to all the environmentals with high IgE (allergy antibody) - Negative labs: inflammatory makers, autoantibodies for autoimmune skin diseases such as dermatomyositis  - continue dupixent   300mg  every 2 weeks  - Continue basic skin care and topical steroids as below - Continue zyrtec 10mg  up to 4 times a day  - Can use additional dose of benadryl  for breakthrough symptoms  - Consider Opzelura  twice a day as needed  for persistent lesions on face - Reach out to tammy about payment options for dupixent .  May consider transition to adbry, ebglyss or JAK-inhibitor (rinvoq or Cibqo) if dupixent  doesn't get covered.    Asthma:  - Continue Symbicort  160 mcg 1 puff daily as needed for asthma  Food Allergy -Continue to avoid egg and shellfish and carry EpiPen   Follow up: 6  months  Thank you so much for letting me partake in your care today.  Don't hesitate to reach out if you have any additional concerns!  Orelia Binet, MD  [facility]- [place], [place]   Skin Care: Daily Care For Maintenance (daily and continue even once eczema controlled) - Recommend hypoallergenic hydrating ointment at least twice daily.  This must be done daily for control of flares. (Great options include Vaseline, CeraVe, Aquaphor, Aveeno, Cetaphil, VaniCream, etc) - Recommend avoiding detergents, soaps or lotions with fragrances/dyes, and instead using products which are hypoallergenic, use second rinse cycle when washing clothes -Wear lose breathable clothing, avoid wool,  avoid any jackets with tight and rough fabric around wrists or neck  -Avoid extremes of humidity - Limit showers/baths to 5 minutes and use luke warm water instead of hot, pat dry following baths, and apply moisturizer  Other:     Thank you so much for letting me partake in your care today.  Don't hesitate to reach out if you have any additional concerns!  Orelia Binet, MD  [facility]- [place], [place]

## 2024-02-15 NOTE — Patient Instructions (Addendum)
 Atopic Dermatitis: well controlled  - Labs showed pansensitization to all the environmentals with high IgE (allergy antibody) - Negative labs: inflammatory makers, autoantibodies for autoimmune skin diseases such as dermatomyositis  - continue dupixent   300mg  every 2 weeks  - Continue basic skin care and topical steroids as below - Continue zyrtec 10mg  up to 4 times a day  - Can use additional dose of benadryl  for breakthrough symptoms  - Consider Opzelura  twice a day as needed for persistent lesions on face - Reach out to tammy about payment options for dupixent .  May consider transition to adbry, ebglyss or JAK-inhibitor (rinvoq or Cibqo) if dupixent  doesn't get covered.    Asthma:  - Continue Symbicort  160 mcg 1 puff daily as needed for asthma  Food Allergy -Continue to avoid egg and shellfish and carry EpiPen   Follow up: 6 months  Thank you so much for letting me partake in your care today.  Don't hesitate to reach out if you have any additional concerns!  Orelia Binet, MD  [facility]- [place], [place]    Skin Care: Daily Care For Maintenance (daily and continue even once eczema controlled) - Recommend hypoallergenic hydrating ointment at least twice daily.  This must be done daily for control of flares. (Great options include Vaseline, CeraVe, Aquaphor, Aveeno, Cetaphil, VaniCream, etc) - Recommend avoiding detergents, soaps or lotions with fragrances/dyes, and instead using products which are hypoallergenic, use second rinse cycle when washing clothes -Wear lose breathable clothing, avoid wool,  avoid any jackets with tight and rough fabric around wrists or neck  -Avoid extremes of humidity - Limit showers/baths to 5 minutes and use luke warm water instead of hot, pat dry following baths, and apply moisturizer - Continue tacrolimus  twice a day   For Flares:(add this to maintenance therapy if needed for flares) - Clobetasol 0.5% to body for severe  flares-apply topically twice daily to red, raised, thickened areas of skin, followed by moisturizer  -As lesions start to heal, stepdown to triamcinolone  0.1% ointment

## 2024-07-10 ENCOUNTER — Other Ambulatory Visit: Payer: Self-pay | Admitting: Obstetrics and Gynecology

## 2024-07-10 DIAGNOSIS — Z1231 Encounter for screening mammogram for malignant neoplasm of breast: Secondary | ICD-10-CM

## 2024-07-21 ENCOUNTER — Ambulatory Visit (HOSPITAL_BASED_OUTPATIENT_CLINIC_OR_DEPARTMENT_OTHER)
Admission: RE | Admit: 2024-07-21 | Discharge: 2024-07-21 | Disposition: A | Discharge status: Home / Self Care | Source: Ambulatory Visit

## 2024-07-21 ENCOUNTER — Other Ambulatory Visit (HOSPITAL_BASED_OUTPATIENT_CLINIC_OR_DEPARTMENT_OTHER): Payer: Self-pay

## 2024-07-21 ENCOUNTER — Encounter (HOSPITAL_BASED_OUTPATIENT_CLINIC_OR_DEPARTMENT_OTHER): Payer: Self-pay

## 2024-07-21 VITALS — BP 157/88 | HR 90 | Temp 98.1°F | Resp 20

## 2024-07-21 DIAGNOSIS — H1031 Unspecified acute conjunctivitis, right eye: Secondary | ICD-10-CM

## 2024-07-21 DIAGNOSIS — R519 Headache, unspecified: Secondary | ICD-10-CM | POA: Diagnosis not present

## 2024-07-21 DIAGNOSIS — R590 Localized enlarged lymph nodes: Secondary | ICD-10-CM

## 2024-07-21 MED ORDER — MOXIFLOXACIN HCL 0.5 % OP SOLN
1.0000 [drp] | Freq: Three times a day (TID) | OPHTHALMIC | 0 refills | Status: DC
  Filled 2024-07-21: qty 3, 10d supply, fill #0

## 2024-07-21 MED ORDER — CEPHALEXIN 500 MG PO CAPS
1000.0000 mg | ORAL_CAPSULE | Freq: Two times a day (BID) | ORAL | 0 refills | Status: AC
  Filled 2024-07-21: qty 28, 7d supply, fill #0

## 2024-07-21 NOTE — Discharge Instructions (Addendum)
 Conjunctivitis of right eye with right facial pain and anterior cervical lymphadenopathy: Moxifloxacin ophthalmic drops, 1 drop 3 times a day to right eye for 5 to 7 days.  Give plenty of fluids and rest.  If right facial pain and enlarged lymph nodes at the neck do not improve and resolve, start the cephalexin, 500 mg, 2 pills twice a day for 7 days.  Follow-up if symptoms do not improve, worsen or new symptoms occur.

## 2024-07-21 NOTE — ED Triage Notes (Signed)
 Pt states that on Wednesday she started to have right eye burning. She thought it was irritation from her contacts. Yesterday when she woke up the eye was red, draining, painful, and she is having right cheek pain.

## 2024-07-21 NOTE — ED Provider Notes (Signed)
 [facility]    CSN: [id number] Arrival date & time: 07/21/24  0807      History   Chief Complaint Chief Complaint  Patient presents with   Eye Problem    HPI Melinda Guerrero is a 53 y.o. female.   52 year old female who noticed redness and burning of her right eye on 07/19/2024.  On 07/20/2024 her eye was swollen and very watery but not matted shut.  Today it continues with more redness and swelling and she thinks she might have a stye on the upper eyelid or pinkeye infection.  Today she also has some swollen glands in her neck and some pain or pressure in her right upper cheek.   Eye Problem Associated symptoms: discharge and redness   Associated symptoms: no nausea and no vomiting     Past Medical History:  Diagnosis Date   Angio-edema    Arthritis    left hand after carpal tunnel release   Asthma    Celiac disease    Eczema    Osteoarthritis resulting from right hip dysplasia 05/07/2015   Pneumonia    PONV (postoperative nausea and vomiting)    Urticaria     Patient Active Problem List   Diagnosis Date Noted   Other atopic dermatitis 01/28/2023   History of allergy to eggs 01/28/2023   History of allergy to shellfish 01/28/2023   Osteoarthritis resulting from right hip dysplasia 05/07/2015   Hip arthritis 05/07/2015   Celiac disease    PONV (postoperative nausea and vomiting)    Asthma     Past Surgical History:  Procedure Laterality Date   BREAST SURGERY     breast biopsy (3 clips on right breast)   CARPAL TUNNEL RELEASE Left    CARPAL TUNNEL RELEASE Right    left leg surgery     tendons and nerves removed to use to rebuild hand   TOTAL HIP ARTHROPLASTY Right 05/07/2015   Procedure: TOTAL HIP ARTHROPLASTY;  Surgeon: Fonda Olmsted, MD;  Location: [facility] OR;  Service: Orthopedics;  Laterality: Right;    OB History   No obstetric history on file.      Home Medications    Prior to Admission medications   Medication Sig Start Date End  Date Taking? Authorizing Provider  albuterol  (PROVENTIL  HFA) 108 (90 Base) MCG/ACT inhaler Inhale 2 puffs into the lungs every 4 (four) hours as needed for wheezing or shortness of breath. 08/05/23   Lorin Norris, MD  moxifloxacin (VIGAMOX) 0.5 % ophthalmic solution Place 1 drop into the right eye 3 (three) times daily. 07/21/24  Yes Ival Domino, FNP  albuterol  (PROVENTIL ) (2.5 MG/3ML) 0.083% nebulizer solution Inhale into the lungs. 12/23/17   [provider]  ALPRAZolam (XANAX) 1 MG tablet Take 1 mg by mouth at bedtime.    [provider]  amitriptyline (ELAVIL) 25 MG tablet Take 25 mg by mouth at bedtime. 07/10/24   [provider]  cephALEXin (KEFLEX) 500 MG capsule Take 2 capsules (1,000 mg total) by mouth 2 (two) times daily for 7 days. 07/21/24 07/28/24  Ival Domino, FNP  cetirizine (ZYRTEC) 10 MG tablet Take 10 mg by mouth in the morning, at noon, in the evening, and at bedtime. 11/28/22   [provider]  DULoxetine (CYMBALTA) 60 MG capsule Take by mouth. 08/24/22   [provider]  ipratropium (ATROVENT) 0.02 % nebulizer solution Take 0.5 mg by nebulization every 6 (six) hours as needed for wheezing or shortness of breath.  [provider]  levonorgestrel (MIRENA, 52 MG,) 20 MCG/DAY IUD by Intrauterine route. 05/10/19   [provider]  Magnesium  Gluconate 550 MG TABS Take by mouth. 08/13/22   [provider]  montelukast  (SINGULAIR ) 10 MG tablet Take 1 tablet (10 mg total) by mouth at bedtime. 08/03/23   Lorin Norris, MD  SYMBICORT  160-4.5 MCG/ACT inhaler Inhale 1 puff into the lungs daily as needed (shortness of breath). 08/03/23   Lorin Norris, MD    Family History Family History  Problem Relation Age of Onset   Asthma Mother    Eczema Mother    Urticaria Mother    Hypertension Mother    Glaucoma Mother    Heart disease Mother    Lung cancer Father    Breast cancer Neg Hx    Allergic rhinitis Neg  Hx     Social History Social History   Tobacco Use   Smoking status: Never   Smokeless tobacco: Never  Vaping Use   Vaping status: Never Used  Substance Use Topics   Alcohol use: No   Drug use: No     Allergies   Egg-derived products, Iodine, Other, Shellfish allergy, and Iohexol   Review of Systems Review of Systems  Constitutional:  Negative for chills and fever.  HENT:  Positive for sinus pressure. Negative for ear pain and sore throat.   Eyes:  Positive for pain, discharge and redness. Negative for visual disturbance.  Respiratory:  Negative for cough and shortness of breath.   Cardiovascular:  Negative for chest pain and palpitations.  Gastrointestinal:  Negative for abdominal pain, constipation, diarrhea, nausea and vomiting.  Genitourinary:  Negative for dysuria and hematuria.  Musculoskeletal:  Negative for arthralgias and back pain.  Skin:  Negative for color change and rash.  Neurological:  Negative for seizures and syncope.  Hematological:  Positive for adenopathy.  All other systems reviewed and are negative.    Physical Exam Triage Vital Signs ED Triage Vitals  Encounter Vitals Group     BP 07/21/24 0821 (!) 157/88     Girls Systolic BP Percentile --      Girls Diastolic BP Percentile --      Boys Systolic BP Percentile --      Boys Diastolic BP Percentile --      Pulse Rate 07/21/24 0821 90     Resp 07/21/24 0821 20     Temp 07/21/24 0821 98.1 F (36.7 C)     Temp Source 07/21/24 0821 Oral     SpO2 07/21/24 0821 99 %     Weight --      Height --      Head Circumference --      Peak Flow --      Pain Score 07/21/24 0817 7     Pain Loc --      Pain Education --      Exclude from Growth Chart --    No data found.  Updated Vital Signs BP (!) 157/88 (BP Location: Right Arm)   Pulse 90   Temp 98.1 F (36.7 C) (Oral)   Resp 20   SpO2 99%   Visual Acuity Right Eye Distance:   Left Eye Distance:   Bilateral Distance:    Right Eye  Near:   Left Eye Near:    Bilateral Near:     Physical Exam Vitals and nursing note reviewed.  Constitutional:      General: She is not in acute distress.  Appearance: She is well-developed. She is not ill-appearing or toxic-appearing.  HENT:     Head: Normocephalic and atraumatic.     Right Ear: Hearing, tympanic membrane, ear canal and external ear normal.     Left Ear: Hearing, tympanic membrane, ear canal and external ear normal.     Nose: No congestion or rhinorrhea.     Right Sinus: Maxillary sinus tenderness (No pain but this sinus area feels pressurized or full or the right cheek is tender.) present. No frontal sinus tenderness.     Left Sinus: No maxillary sinus tenderness or frontal sinus tenderness.     Mouth/Throat:     Lips: Pink.     Mouth: Mucous membranes are moist.     Dentition: Normal dentition.     Pharynx: Uvula midline. No oropharyngeal exudate or posterior oropharyngeal erythema.     Tonsils: No tonsillar exudate.  Eyes:     General: No allergic shiner, visual field deficit or scleral icterus.       Right eye: Discharge (Watery) and hordeolum (Possible small right stye on right lateral upper eyelid) present. No foreign body.        Left eye: No foreign body, discharge or hordeolum.     Extraocular Movements:     Right eye: Normal extraocular motion and no nystagmus.     Left eye: Normal extraocular motion.     Conjunctiva/sclera:     Right eye: Right conjunctiva is injected. Exudate (Watery) present. No chemosis or hemorrhage.    Left eye: Left conjunctiva is not injected. No chemosis, exudate or hemorrhage.    Pupils: Pupils are equal, round, and reactive to light.  Cardiovascular:     Rate and Rhythm: Normal rate and regular rhythm.     Heart sounds: S1 normal and S2 normal. No murmur heard. Pulmonary:     Effort: Pulmonary effort is normal. No respiratory distress.     Breath sounds: Normal breath sounds. No decreased breath sounds, wheezing, rhonchi  or rales.  Abdominal:     General: Bowel sounds are normal.     Palpations: Abdomen is soft.     Tenderness: There is no abdominal tenderness.  Musculoskeletal:        General: No swelling.     Cervical back: Neck supple.  Lymphadenopathy:     Head:     Right side of head: Tonsillar adenopathy present. No submental, submandibular, preauricular or posterior auricular adenopathy.     Left side of head: No submental, submandibular, tonsillar, preauricular or posterior auricular adenopathy.     Cervical: Cervical adenopathy present.     Right cervical: Superficial cervical adenopathy present.     Left cervical: No superficial cervical adenopathy.  Skin:    General: Skin is warm and dry.     Capillary Refill: Capillary refill takes less than 2 seconds.     Findings: No rash.  Neurological:     Mental Status: She is alert and oriented to person, place, and time.  Psychiatric:        Mood and Affect: Mood normal.      UC Treatments / Results  Labs (all labs ordered are listed, but only abnormal results are displayed) Labs Reviewed - No data to display  EKG   Radiology No results found.  Procedures Procedures (including critical care time)  Medications Ordered in UC Medications - No data to display  Initial Impression / Assessment and Plan / UC Course  I have reviewed the triage vital signs and  the nursing notes.  Pertinent labs & imaging results that were available during my care of the patient were reviewed by me and considered in my medical decision making (see chart for details).  Plan of Care: Conjunctivitis of right eye with right facial pain and anterior cervical lymphadenopathy: Moxifloxacin ophthalmic drops, 1 drop 3 times a day to right eye for 5 to 7 days.  Give plenty of fluids and rest.  If right facial pain and enlarged lymph nodes at the neck do not improve and resolve, start the cephalexin, 500 mg, 2 pills twice a day for 7 days.  Follow-up if symptoms do  not improve, worsen or new symptoms occur.  I reviewed the plan of care with the patient and/or the patient's guardian.  The patient and/or guardian had time to ask questions and acknowledged that the questions were answered.  I provided instruction on symptoms or reasons to return here or to go to an ER, if symptoms/condition did not improve, worsened or if new symptoms occurred.  Final Clinical Impressions(s) / UC Diagnoses   Final diagnoses:  Acute conjunctivitis of right eye, unspecified acute conjunctivitis type  Right facial pain  Anterior cervical lymphadenopathy     Discharge Instructions      Conjunctivitis of right eye with right facial pain and anterior cervical lymphadenopathy: Moxifloxacin ophthalmic drops, 1 drop 3 times a day to right eye for 5 to 7 days.  Give plenty of fluids and rest.  If right facial pain and enlarged lymph nodes at the neck do not improve and resolve, start the cephalexin, 500 mg, 2 pills twice a day for 7 days.  Follow-up if symptoms do not improve, worsen or new symptoms occur.    ED Prescriptions     Medication Sig Dispense Auth. Provider   cephALEXin (KEFLEX) 500 MG capsule Take 2 capsules (1,000 mg total) by mouth 2 (two) times daily for 7 days. 28 capsule Ival Domino, FNP   moxifloxacin (VIGAMOX) 0.5 % ophthalmic solution Place 1 drop into the right eye 3 (three) times daily. 3 mL Ival Domino, FNP      PDMP not reviewed this encounter.   Ival Domino, FNP 07/21/24 [phone]

## 2024-08-11 ENCOUNTER — Ambulatory Visit
Admission: RE | Admit: 2024-08-11 | Discharge: 2024-08-11 | Disposition: A | Discharge status: Home / Self Care | Source: Ambulatory Visit | Attending: Obstetrics and Gynecology | Admitting: Obstetrics and Gynecology

## 2024-08-11 DIAGNOSIS — Z1231 Encounter for screening mammogram for malignant neoplasm of breast: Secondary | ICD-10-CM

## 2024-08-16 ENCOUNTER — Ambulatory Visit: Admitting: Internal Medicine

## 2024-09-22 ENCOUNTER — Ambulatory Visit: Admitting: Internal Medicine

## 2024-10-13 ENCOUNTER — Other Ambulatory Visit (HOSPITAL_BASED_OUTPATIENT_CLINIC_OR_DEPARTMENT_OTHER): Payer: Self-pay

## 2024-10-13 MED ORDER — MOXIFLOXACIN HCL 0.5 % OP SOLN
1.0000 [drp] | Freq: Three times a day (TID) | OPHTHALMIC | 0 refills | Status: AC
  Filled 2024-10-13: qty 3, 20d supply, fill #0

## 2024-10-13 MED ORDER — PREDNISONE 10 MG (48) PO TBPK
ORAL_TABLET | ORAL | 0 refills | Status: AC
  Filled 2024-10-13: qty 48, 12d supply, fill #0

## 2024-10-13 MED ORDER — TRIAMCINOLONE ACETONIDE 0.1 % MT PSTE
PASTE | Freq: Four times a day (QID) | OROMUCOSAL | 0 refills | Status: AC
  Filled 2024-10-13: qty 5, 7d supply, fill #0

## 2024-10-13 MED ORDER — AMOXICILLIN 500 MG PO CAPS
500.0000 mg | ORAL_CAPSULE | Freq: Three times a day (TID) | ORAL | 0 refills | Status: AC
  Filled 2024-10-13: qty 21, 7d supply, fill #0
